# Patient Record
Sex: Female | Born: 1995 | Race: White | Hispanic: No | Marital: Single | Smoking: Never smoker
Health system: Southern US, Community
[De-identification: ages and names within clinical notes are randomized; demographics above are authoritative.]

## PROBLEM LIST (undated history)

## (undated) ENCOUNTER — Ambulatory Visit (HOSPITAL_COMMUNITY): Admission: EM | Payer: BC Managed Care – PPO | Source: Home / Self Care

## (undated) DIAGNOSIS — D689 Coagulation defect, unspecified: Secondary | ICD-10-CM

## (undated) DIAGNOSIS — F32A Depression, unspecified: Secondary | ICD-10-CM

## (undated) DIAGNOSIS — E559 Vitamin D deficiency, unspecified: Secondary | ICD-10-CM

## (undated) DIAGNOSIS — R000 Tachycardia, unspecified: Secondary | ICD-10-CM

## (undated) DIAGNOSIS — Z862 Personal history of diseases of the blood and blood-forming organs and certain disorders involving the immune mechanism: Secondary | ICD-10-CM

## (undated) DIAGNOSIS — F909 Attention-deficit hyperactivity disorder, unspecified type: Secondary | ICD-10-CM

## (undated) DIAGNOSIS — F419 Anxiety disorder, unspecified: Secondary | ICD-10-CM

## (undated) HISTORY — DX: Tachycardia, unspecified: R00.0

## (undated) HISTORY — DX: Depression, unspecified: F32.A

## (undated) HISTORY — DX: Attention-deficit hyperactivity disorder, unspecified type: F90.9

## (undated) HISTORY — PX: NO PAST SURGERIES: SHX2092

## (undated) HISTORY — DX: Coagulation defect, unspecified: D68.9

---
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
## 2014-07-23 ENCOUNTER — Other Ambulatory Visit: Payer: Self-pay | Admitting: Family Medicine

## 2014-07-23 DIAGNOSIS — E010 Iodine-deficiency related diffuse (endemic) goiter: Secondary | ICD-10-CM

## 2014-07-26 ENCOUNTER — Ambulatory Visit
Admission: RE | Admit: 2014-07-26 | Discharge: 2014-07-26 | Disposition: A | Discharge status: Home / Self Care | Payer: BLUE CROSS/BLUE SHIELD | Source: Ambulatory Visit | Attending: Family Medicine | Admitting: Family Medicine

## 2014-07-26 DIAGNOSIS — E010 Iodine-deficiency related diffuse (endemic) goiter: Secondary | ICD-10-CM

## 2015-07-19 ENCOUNTER — Other Ambulatory Visit: Payer: Self-pay | Admitting: Family Medicine

## 2015-07-19 DIAGNOSIS — E010 Iodine-deficiency related diffuse (endemic) goiter: Secondary | ICD-10-CM

## 2015-07-19 DIAGNOSIS — E040 Nontoxic diffuse goiter: Secondary | ICD-10-CM

## 2015-08-02 ENCOUNTER — Ambulatory Visit
Admission: RE | Admit: 2015-08-02 | Discharge: 2015-08-02 | Disposition: A | Discharge status: Home / Self Care | Payer: BLUE CROSS/BLUE SHIELD | Source: Ambulatory Visit | Attending: Family Medicine | Admitting: Family Medicine

## 2015-08-02 DIAGNOSIS — E040 Nontoxic diffuse goiter: Secondary | ICD-10-CM

## 2015-08-02 DIAGNOSIS — E010 Iodine-deficiency related diffuse (endemic) goiter: Secondary | ICD-10-CM

## 2016-07-02 DIAGNOSIS — J069 Acute upper respiratory infection, unspecified: Secondary | ICD-10-CM | POA: Diagnosis not present

## 2016-08-10 DIAGNOSIS — E040 Nontoxic diffuse goiter: Secondary | ICD-10-CM | POA: Diagnosis not present

## 2016-08-10 DIAGNOSIS — F909 Attention-deficit hyperactivity disorder, unspecified type: Secondary | ICD-10-CM | POA: Diagnosis not present

## 2016-09-11 ENCOUNTER — Other Ambulatory Visit: Payer: Self-pay | Admitting: Obstetrics and Gynecology

## 2016-09-11 ENCOUNTER — Other Ambulatory Visit (HOSPITAL_COMMUNITY)
Admission: RE | Admit: 2016-09-11 | Discharge: 2016-09-11 | Disposition: A | Discharge status: Home / Self Care | Payer: BLUE CROSS/BLUE SHIELD | Source: Ambulatory Visit | Attending: Obstetrics and Gynecology | Admitting: Obstetrics and Gynecology

## 2016-09-11 DIAGNOSIS — N920 Excessive and frequent menstruation with regular cycle: Secondary | ICD-10-CM | POA: Diagnosis not present

## 2016-09-11 DIAGNOSIS — Z01419 Encounter for gynecological examination (general) (routine) without abnormal findings: Secondary | ICD-10-CM | POA: Diagnosis not present

## 2016-09-11 DIAGNOSIS — Z3009 Encounter for other general counseling and advice on contraception: Secondary | ICD-10-CM | POA: Diagnosis not present

## 2016-09-11 DIAGNOSIS — Z113 Encounter for screening for infections with a predominantly sexual mode of transmission: Secondary | ICD-10-CM | POA: Diagnosis not present

## 2016-09-11 DIAGNOSIS — F3281 Premenstrual dysphoric disorder: Secondary | ICD-10-CM | POA: Diagnosis not present

## 2016-09-11 DIAGNOSIS — Z01411 Encounter for gynecological examination (general) (routine) with abnormal findings: Secondary | ICD-10-CM | POA: Diagnosis not present

## 2016-09-12 LAB — CYTOLOGY - PAP: DIAGNOSIS: NEGATIVE

## 2016-09-13 DIAGNOSIS — D696 Thrombocytopenia, unspecified: Secondary | ICD-10-CM | POA: Diagnosis not present

## 2016-10-04 ENCOUNTER — Encounter: Payer: Self-pay | Admitting: Hematology

## 2016-10-04 ENCOUNTER — Telehealth: Payer: Self-pay | Admitting: Hematology

## 2016-10-04 NOTE — Telephone Encounter (Signed)
Cld the pt to schedule an appt to see Dr. Candise CheKale on 4/30 at 1pm. Pt aware to arrive 30 minutes prior. Demographics verified. Letter mailed to the pt and faxed to the referring office

## 2016-10-23 DIAGNOSIS — N920 Excessive and frequent menstruation with regular cycle: Secondary | ICD-10-CM | POA: Diagnosis not present

## 2016-10-23 DIAGNOSIS — D696 Thrombocytopenia, unspecified: Secondary | ICD-10-CM | POA: Diagnosis not present

## 2016-11-05 ENCOUNTER — Ambulatory Visit: Payer: BLUE CROSS/BLUE SHIELD

## 2016-11-05 ENCOUNTER — Other Ambulatory Visit (HOSPITAL_BASED_OUTPATIENT_CLINIC_OR_DEPARTMENT_OTHER): Payer: BLUE CROSS/BLUE SHIELD

## 2016-11-05 ENCOUNTER — Ambulatory Visit (HOSPITAL_BASED_OUTPATIENT_CLINIC_OR_DEPARTMENT_OTHER): Payer: BLUE CROSS/BLUE SHIELD | Admitting: Hematology & Oncology

## 2016-11-05 VITALS — BP 108/71 | HR 101 | Temp 98.1°F | Resp 16 | Wt 114.8 lb

## 2016-11-05 DIAGNOSIS — D696 Thrombocytopenia, unspecified: Secondary | ICD-10-CM

## 2016-11-05 LAB — CBC WITH DIFFERENTIAL (CANCER CENTER ONLY)
BASO#: 0 10*3/uL (ref 0.0–0.2)
BASO%: 0.2 % (ref 0.0–2.0)
EOS ABS: 0.1 10*3/uL (ref 0.0–0.5)
EOS%: 1.3 % (ref 0.0–7.0)
HEMATOCRIT: 40.5 % (ref 34.8–46.6)
HEMOGLOBIN: 13.5 g/dL (ref 11.6–15.9)
LYMPH#: 1.2 10*3/uL (ref 0.9–3.3)
LYMPH%: 24.8 % (ref 14.0–48.0)
MCH: 30.8 pg (ref 26.0–34.0)
MCHC: 33.3 g/dL (ref 32.0–36.0)
MCV: 93 fL (ref 81–101)
MONO#: 0.5 10*3/uL (ref 0.1–0.9)
MONO%: 10.8 % (ref 0.0–13.0)
NEUT%: 62.9 % (ref 39.6–80.0)
NEUTROS ABS: 3 10*3/uL (ref 1.5–6.5)
Platelets: 137 10*3/uL — ABNORMAL LOW (ref 145–400)
RBC: 4.38 10*6/uL (ref 3.70–5.32)
RDW: 12.7 % (ref 11.1–15.7)
WBC: 4.7 10*3/uL (ref 3.9–10.0)

## 2016-11-05 LAB — CHCC SATELLITE - SMEAR

## 2016-11-05 NOTE — Progress Notes (Signed)
Referral MD  Reason for Referral: Mild thrombocytopenia and transient leukopenia   Chief Complaint  Patient presents with  . New Patient (Initial Visit)  : My platelet count is low.  HPI: Nancy Wallace is a very charming 21 year old white female. She comes in with her mother. She is adopted. Would do not know much about her family's past history.  She is currently working at a [employer].  She was seen by Dr.Varnado and has some routine lab work done. This was done I think because she was tired. She was found to have a very, very low vitamin D level. The vitamin D level was 5.6. However, a CBC was done. This showed a white cell count of 3.8. Hemoglobin 13.3. MCV 91. Platelet count was 114,000. She was tested for HIV and hepatitis C. Both were normal.  I think she was started on some agent to help with her menstrual cycles.  Because of the thrombocytopenia, it was felt that she needed re-seen by hematology. She was kindly referred to the [facility].  She does not smoke. She does not drink. She did go to college for one year and then came back and is now working.  She has had no weight loss or weight gain. She is not a 21.  She's had no change in bowel or bladder habits. She's had no cough. This been no rashes.  She has had some skin lesions removed which are benign.  There is been no hair loss. She's had no swallowing difficulties. She's had no visual problems.  Overall, her performance status is ECOG 0.   No past medical history on file.:  No past surgical history on file.:   Current Outpatient Prescriptions:  .  ADDERALL XR 15 MG 24 hr capsule, , Disp: , Rfl: 0 .  amphetamine-dextroamphetamine (ADDERALL) 5 MG tablet, , Disp: , Rfl: 0 .  FLUoxetine (PROZAC) 10 MG capsule, , Disp: , Rfl: 4 .  tranexamic acid (LYSTEDA) 650 MG TABS tablet, , Disp: , Rfl: 4 .  Vitamin D, Ergocalciferol, (DRISDOL) 50000 units CAPS capsule, , Disp: , Rfl: 0:  :  No  Known Allergies:  No family history on file.:  Social History   Social History  . Marital status: Single    Spouse name: N/A  . Number of children: N/A  . Years of education: N/A   Occupational History  . Not on file.   Social History Main Topics  . Smoking status: Not on file  . Smokeless tobacco: Not on file  . Alcohol use Not on file  . Drug use: Unknown  . Sexual activity: Not on file   Other Topics Concern  . Not on file   Social History Narrative  . No narrative on file  :  Pertinent items are noted in HPI.  Exam:Well-developed and well-nourished white female in no obvious distress. Vital signs show a temperature of 98.1. Pulse 101. Blood pressure 108/71. Weight is 114 pounds. Head and neck exam shows no ocular or oral lesions. There are no palpable cervical or supraclavicular lymph nodes. Lungs are clear bilaterally. Cardiac exam regular rate and rhythm with no murmurs, rubs or bruits. Axillary exam shows no bilateral axillary adenopathy. Abdomen is soft. She has good bowel sounds. There is no fluid wave. There is no palpable liver or spleen tip. Back exam shows no tenderness over the spine, ribs or hips. Extremities shows no clubbing, cyanosis or edema. Shows good range motion of her joints. There  is no joint swelling, warmth or inflammation. Skin exam does show a couple hyperpigmented lesions on the upper part of her chest. There are slightly dark and irregular in shape. Neurological exam shows no focal neurological deficits.    Recent Labs  11/05/16 1511  WBC 4.7  HGB 13.5  HCT 40.5  PLT 137 Platelet count confirmed by slide estimate*   No results for input(s): NA, K, CL, CO2, GLUCOSE, BUN, CREATININE, CALCIUM in the last 72 hours.  Blood smear review: Normochromic and normocytic population of red blood cells. She has no nucleated red blood cells. There are no teardrop cells. She has no rouleau formation. I see no target cells. She has no inclusion bodies. White  blood cells. Normal in morphology maturation. She has no immature myeloid or lymphoid forms. She has no hyper segmented polys. Platelets are minimally decreased in number. Platelets are well granulated. Platelets are relatively uniform in size.  Pathology: None     Assessment and Plan:  Nancy Wallace is a very charming 21 year old white female. Again, she is adopted.  I just don't see anything that looks suspicious on her blood smear or on her physical exam. 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 I'll see anything that would suggest a hematologic malignancy. She clearly does not have any leukemia or other bone marrow disorder.  I spent about 45 minutes with she and her mom. I went over the lab work with them.  With her medications, I suppose that the medications may cause some issues. She is on Adderall. She is on Prozac. These can certainly affect her blood counts but I would not take her off these.  I think we should get her back in 4 months. I think everything still looks good in 4 months, then we can let her go from the clinic.  It was a lot of fun talking with she and her mom. 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 This actually is only 5 minutes from where I live. Thankfully, the 20th of that hit last Sunday did not affect their home.

## 2016-11-12 ENCOUNTER — Encounter: Payer: BLUE CROSS/BLUE SHIELD | Admitting: Hematology

## 2016-12-18 DIAGNOSIS — E559 Vitamin D deficiency, unspecified: Secondary | ICD-10-CM | POA: Diagnosis not present

## 2016-12-28 DIAGNOSIS — J029 Acute pharyngitis, unspecified: Secondary | ICD-10-CM | POA: Diagnosis not present

## 2017-02-08 DIAGNOSIS — F909 Attention-deficit hyperactivity disorder, unspecified type: Secondary | ICD-10-CM | POA: Diagnosis not present

## 2017-02-18 ENCOUNTER — Ambulatory Visit (HOSPITAL_BASED_OUTPATIENT_CLINIC_OR_DEPARTMENT_OTHER): Payer: BLUE CROSS/BLUE SHIELD | Admitting: Hematology & Oncology

## 2017-02-18 ENCOUNTER — Other Ambulatory Visit (HOSPITAL_BASED_OUTPATIENT_CLINIC_OR_DEPARTMENT_OTHER): Payer: BLUE CROSS/BLUE SHIELD

## 2017-02-18 VITALS — BP 123/72 | HR 72 | Temp 98.4°F | Resp 16 | Wt 112.0 lb

## 2017-02-18 DIAGNOSIS — D696 Thrombocytopenia, unspecified: Secondary | ICD-10-CM

## 2017-02-18 DIAGNOSIS — D693 Immune thrombocytopenic purpura: Secondary | ICD-10-CM

## 2017-02-18 LAB — CHCC SATELLITE - SMEAR

## 2017-02-18 LAB — CBC WITH DIFFERENTIAL (CANCER CENTER ONLY)
BASO#: 0 10*3/uL (ref 0.0–0.2)
BASO%: 0.2 % (ref 0.0–2.0)
EOS%: 0.4 % (ref 0.0–7.0)
Eosinophils Absolute: 0 10*3/uL (ref 0.0–0.5)
HEMATOCRIT: 41.2 % (ref 34.8–46.6)
HEMOGLOBIN: 13.8 g/dL (ref 11.6–15.9)
LYMPH#: 1 10*3/uL (ref 0.9–3.3)
LYMPH%: 16.8 % (ref 14.0–48.0)
MCH: 31.2 pg (ref 26.0–34.0)
MCHC: 33.5 g/dL (ref 32.0–36.0)
MCV: 93 fL (ref 81–101)
MONO#: 0.4 10*3/uL (ref 0.1–0.9)
MONO%: 7.1 % (ref 0.0–13.0)
NEUT%: 75.5 % (ref 39.6–80.0)
NEUTROS ABS: 4.3 10*3/uL (ref 1.5–6.5)
Platelets: 117 10*3/uL — ABNORMAL LOW (ref 145–400)
RBC: 4.42 10*6/uL (ref 3.70–5.32)
RDW: 12.5 % (ref 11.1–15.7)
WBC: 5.7 10*3/uL (ref 3.9–10.0)

## 2017-02-18 NOTE — Progress Notes (Signed)
Hematology and Oncology Follow Up Visit  Nancy Wallace [id number] 08-08-1995 20 y.o. 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 We first saw her back in April. At that time, her platelet count was 137,000. By her blood smear, I thought that she probably had an element of mild immune thrombocytopenia.  She has had a busy summer. She just got back from the beach. Her family was down there. She had a great time. She is very liberal with sunscreen.  She's had no bleeding. She's had no bruising. She's had no change in bowel or bladder habits. She's had no issues with her monthly cycle.  She is working full-time. She will start school later on this month.  She's had no fever. She's had no headache. His been no cough.  The really has not been any change in her medications.  Her appetite is good. She is not a vegetarian.  Overall, her performance status is ECOG 0.  Medications:  Current Outpatient Prescriptions:  .  ADDERALL XR 15 MG 24 hr capsule, , Disp: , Rfl: 0 .  amphetamine-dextroamphetamine (ADDERALL) 5 MG tablet, 10 mg. , Disp: , Rfl: 0 .  FLUoxetine (PROZAC) 10 MG capsule, , Disp: , Rfl: 4 .  tranexamic acid (LYSTEDA) 650 MG TABS tablet, , Disp: , Rfl: 4 .  Vitamin D, Ergocalciferol, (DRISDOL) 50000 units CAPS capsule, , Disp: , Rfl: 0  Allergies: No Known Allergies  Past Medical History, Surgical history, Social history, and Family History were reviewed and updated.  Review of Systems:  As above  Physical Exam:  weight is 112 lb (50.8 kg). Her oral temperature is 98.4 F (36.9 C). Her blood pressure is 123/72 and her pulse is 72. Her respiration is 16 and oxygen saturation is 94%.   Wt Readings from Last 3 Encounters:  02/18/17 112 lb (50.8 kg)  11/05/16 114 lb 12.8 oz (52.1 kg)     Head and neck exam shows  no ocular or oral lesions. There are no palpable cervical or supraclavicular lymph nodes. Lungs are clear bilaterally. Cardiac exam regular rate and rhythm with no murmurs, rubs or bruits. Axillary exam shows no bilateral axillary adenopathy. Abdomen is soft. She has good bowel sounds. There is no fluid wave. There is no palpable liver or spleen tip. Back exam shows no tenderness over the spine, ribs or hips. Extremities shows no clubbing, cyanosis or edema. Shows good range motion of her joints. There is no joint swelling, warmth or inflammation. Skin exam does show a couple hyperpigmented lesions on the upper part of her chest. There are slightly dark and irregular in shape. Neurological exam shows no focal neurological deficits.  Lab Results  Component Value Date   WBC 5.7 02/18/2017   HGB 13.8 02/18/2017   HCT 41.2 02/18/2017   MCV 93 02/18/2017   PLT 117 Platelet count consistent in citrate (L) 02/18/2017     Chemistry   No results found for: NA, K, CL, CO2, BUN, CREATININE, GLU No results found for: CALCIUM, ALKPHOS, AST, ALT, BILITOT       Impression and Plan: Nancy Wallace is a24 year old white female. She has mild thrombocytopenia. Again I have to believe that this is immune-based.  I cannot find anything on her exam that was suggestive and hematologic malignancy. I don't think we had to put her through a workup for  collagen vascular disease.  She definitely does not need a bone marrow test.  I think we can get her back in 4 months. I think this would be very reasonable.  She was noted that she come back sooner for lab work if she starts having any kind of symptoms.   Josph MachoENNEVER,PETER R, MD 8/6/20184:54 PM

## 2017-07-01 ENCOUNTER — Other Ambulatory Visit: Payer: Self-pay

## 2017-07-01 ENCOUNTER — Other Ambulatory Visit (HOSPITAL_BASED_OUTPATIENT_CLINIC_OR_DEPARTMENT_OTHER): Payer: BLUE CROSS/BLUE SHIELD

## 2017-07-01 ENCOUNTER — Ambulatory Visit (HOSPITAL_BASED_OUTPATIENT_CLINIC_OR_DEPARTMENT_OTHER): Payer: BLUE CROSS/BLUE SHIELD | Admitting: Hematology & Oncology

## 2017-07-01 ENCOUNTER — Encounter: Payer: Self-pay | Admitting: Hematology & Oncology

## 2017-07-01 VITALS — BP 130/80 | HR 113 | Temp 98.3°F | Resp 16 | Wt 112.0 lb

## 2017-07-01 DIAGNOSIS — D693 Immune thrombocytopenic purpura: Secondary | ICD-10-CM | POA: Diagnosis not present

## 2017-07-01 LAB — CBC WITH DIFFERENTIAL (CANCER CENTER ONLY)
BASO#: 0 10*3/uL (ref 0.0–0.2)
BASO%: 0.2 % (ref 0.0–2.0)
EOS ABS: 0 10*3/uL (ref 0.0–0.5)
EOS%: 0.4 % (ref 0.0–7.0)
HEMATOCRIT: 42.2 % (ref 34.8–46.6)
HGB: 14 g/dL (ref 11.6–15.9)
LYMPH#: 1 10*3/uL (ref 0.9–3.3)
LYMPH%: 22.4 % (ref 14.0–48.0)
MCH: 31 pg (ref 26.0–34.0)
MCHC: 33.2 g/dL (ref 32.0–36.0)
MCV: 93 fL (ref 81–101)
MONO#: 0.3 10*3/uL (ref 0.1–0.9)
MONO%: 7.5 % (ref 0.0–13.0)
NEUT#: 3.2 10*3/uL (ref 1.5–6.5)
NEUT%: 69.5 % (ref 39.6–80.0)
Platelets: 146 10*3/uL (ref 145–400)
RBC: 4.52 10*6/uL (ref 3.70–5.32)
RDW: 12.4 % (ref 11.1–15.7)
WBC: 4.6 10*3/uL (ref 3.9–10.0)

## 2017-07-01 LAB — CHCC SATELLITE - SMEAR

## 2017-07-01 NOTE — Progress Notes (Signed)
Hematology and Oncology Follow Up Visit  Nancy HagemanGrace Wallace [id number] [birth year] 21 y.o. 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  She is doing well.  She is no longer working for a [employer].  This makes me a little bit sad.  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  She is going back to college.  This is very good for her.  She has had no problems with bleeding or bruising.  Her monthly cycles are not that heavy.  She had no fever.  She has had no rashes.  She had no cough or shortness of breath.  There is been no nausea or vomiting.  She has had no change in bowel or bladder habits.  Overall, her performance status is ECOG 0.  Medications:  Current Outpatient Medications:  .  norgestimate-ethinyl estradiol (ORTHO-CYCLEN,SPRINTEC,PREVIFEM) 0.25-35 MG-MCG tablet, Take 1 tablet by mouth daily., Disp: , Rfl:  .  ADDERALL XR 15 MG 24 hr capsule, , Disp: , Rfl: 0 .  amphetamine-dextroamphetamine (ADDERALL) 10 MG tablet, Take 10 mg by mouth., Disp: , Rfl: 0 .  FLUoxetine (PROZAC) 10 MG capsule, , Disp: , Rfl: 4 .  tranexamic acid (LYSTEDA) 650 MG TABS tablet, , Disp: , Rfl: 4 .  Vitamin D, Ergocalciferol, (DRISDOL) 50000 units CAPS capsule, , Disp: , Rfl: 0  Allergies: No Known Allergies  Past Medical History, Surgical history, Social history, and Family History were reviewed and updated.  Review of Systems: As stated in the interim history  Physical Exam:  weight is 112 lb (50.8 kg). Her oral temperature is 98.3 F (36.8 C). Her blood pressure is 130/80 and her pulse is 113 (abnormal). Her respiration is 16 and oxygen saturation is 99%.   Wt Readings from Last 3 Encounters:  07/01/17 112 lb (50.8 kg)  02/18/17 112 lb (50.8 kg)  11/05/16 114 lb 12.8 oz (52.1 kg)     Physical Exam  Constitutional: She is oriented to  person, place, and time.  HENT:  Head: Normocephalic and atraumatic.  Mouth/Throat: Oropharynx is clear and moist.  Eyes: EOM are normal. Pupils are equal, round, and reactive to light.  Neck: Normal range of motion.  Cardiovascular: Normal rate, regular rhythm and normal heart sounds.  Pulmonary/Chest: Effort normal and breath sounds normal.  Abdominal: Soft. Bowel sounds are normal.  Musculoskeletal: Normal range of motion. She exhibits no edema, tenderness or deformity.  Lymphadenopathy:    She has no cervical adenopathy.  Neurological: She is alert and oriented to person, place, and time.  Skin: Skin is warm and dry. No rash noted. No erythema.  Psychiatric: She has a normal mood and affect. Her behavior is normal. Judgment and thought content normal.  Vitals reviewed.    Lab Results  Component Value Date   WBC 4.6 07/01/2017   HGB 14.0 07/01/2017   HCT 42.2 07/01/2017   MCV 93 07/01/2017   PLT 146 Platelet count consistent in citrate 07/01/2017     Chemistry   No results found for: NA, K, CL, CO2, BUN, CREATININE, GLU No results found for: CALCIUM, ALKPHOS, AST, ALT, BILITOT       Impression and Plan: Nancy Wallace is a 21 year old white female. She has mild thrombocytopenia. Again I have to believe that this is immune-based.  Her platelet count is normal today.  I looked at her blood under  the microscope.  Everything looked okay.  We will plan to get her back in 6 months.  I think this would be reasonable.  I told her that she can always come back sooner if she has any problems with bruising or bleeding.  I am glad that she is going to get back into college.  She sounds very excited to get started in January.    Josph MachoPeter R Loyde Orth, MD 12/17/20183:58 PM

## 2017-07-02 LAB — LACTATE DEHYDROGENASE: LDH: 133 U/L (ref 125–245)

## 2017-09-12 DIAGNOSIS — Z01419 Encounter for gynecological examination (general) (routine) without abnormal findings: Secondary | ICD-10-CM | POA: Diagnosis not present

## 2017-09-12 DIAGNOSIS — Z113 Encounter for screening for infections with a predominantly sexual mode of transmission: Secondary | ICD-10-CM | POA: Diagnosis not present

## 2017-11-11 DIAGNOSIS — H04123 Dry eye syndrome of bilateral lacrimal glands: Secondary | ICD-10-CM | POA: Diagnosis not present

## 2017-11-11 DIAGNOSIS — H5213 Myopia, bilateral: Secondary | ICD-10-CM | POA: Diagnosis not present

## 2017-12-23 ENCOUNTER — Inpatient Hospital Stay: Payer: BLUE CROSS/BLUE SHIELD | Attending: Hematology & Oncology | Admitting: Hematology & Oncology

## 2017-12-23 ENCOUNTER — Inpatient Hospital Stay: Payer: BLUE CROSS/BLUE SHIELD

## 2017-12-23 ENCOUNTER — Other Ambulatory Visit: Payer: Self-pay

## 2017-12-23 VITALS — BP 127/84 | HR 93 | Temp 98.9°F | Resp 16 | Wt 113.0 lb

## 2017-12-23 DIAGNOSIS — D696 Thrombocytopenia, unspecified: Secondary | ICD-10-CM | POA: Insufficient documentation

## 2017-12-23 DIAGNOSIS — D693 Immune thrombocytopenic purpura: Secondary | ICD-10-CM

## 2017-12-23 LAB — CBC WITH DIFFERENTIAL (CANCER CENTER ONLY)
BASOS ABS: 0 10*3/uL (ref 0.0–0.1)
BASOS PCT: 0 %
EOS ABS: 0.1 10*3/uL (ref 0.0–0.5)
EOS PCT: 2 %
HCT: 43.9 % (ref 34.8–46.6)
Hemoglobin: 14.8 g/dL (ref 11.6–15.9)
Lymphocytes Relative: 32 %
Lymphs Abs: 1.3 10*3/uL (ref 0.9–3.3)
MCH: 31 pg (ref 25.1–34.0)
MCHC: 33.7 g/dL (ref 31.5–36.0)
MCV: 92 fL (ref 79.5–101.0)
MONO ABS: 0.3 10*3/uL (ref 0.1–0.9)
MONOS PCT: 8 %
NEUTROS ABS: 2.3 10*3/uL (ref 1.5–6.5)
Neutrophils Relative %: 58 %
PLATELETS: 116 10*3/uL — AB (ref 145–400)
RBC: 4.77 MIL/uL (ref 3.70–5.45)
RDW: 12.7 % (ref 11.2–14.5)
WBC Count: 4 10*3/uL (ref 3.9–10.3)

## 2017-12-23 LAB — PLATELET BY CITRATE

## 2017-12-23 NOTE — Progress Notes (Signed)
Hematology and Oncology Follow Up Visit  Nancy Wallace [id number] [birth year] 21 y.o. 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  She really is doing well.  She is taking classes at college for the summer.  She really wants to try to finish up with college.  She was working at a [employer].  She just does not have time to do this any longer.  She does state that every now than when she gets up in the morning she has some nausea.  Rarely will she have vomiting.  There is no associated tinnitus.  There is no vertigo.  She has no fever.  She has no sweats.  She has no obvious change in bowel or bladder habits.  She has had no bleeding.  Her monthly cycles have been doing fairly well.  She has had no leg swelling.  She has had no rashes.  Overall, her performance status is ECOG 0.  Medications:  Current Outpatient Medications:  .  Levonorgestrel-Ethinyl Estradiol (AMETHIA) 0.15-0.03 &0.01 MG tablet, Take 1 tablet by mouth daily., Disp: , Rfl:  .  ADDERALL XR 15 MG 24 hr capsule, , Disp: , Rfl: 0 .  amphetamine-dextroamphetamine (ADDERALL) 10 MG tablet, Take 10 mg by mouth., Disp: , Rfl: 0 .  FLUoxetine (PROZAC) 10 MG capsule, , Disp: , Rfl: 4 .  tranexamic acid (LYSTEDA) 650 MG TABS tablet, , Disp: , Rfl: 4 .  Vitamin D, Ergocalciferol, (DRISDOL) 50000 units CAPS capsule, , Disp: , Rfl: 0  Allergies: No Known Allergies  Past Medical History, Surgical history, Social history, and Family History were reviewed and updated.  Review of Systems: Review of Systems  Constitutional: Negative.   HENT: Negative.   Eyes: Negative.   Respiratory: Negative.   Cardiovascular: Negative.   Gastrointestinal: Positive for nausea.  Genitourinary: Negative.   Musculoskeletal: Negative.   Skin: Negative.   Neurological: Negative.     Endo/Heme/Allergies: Negative.   Psychiatric/Behavioral: Negative.      Physical Exam:  weight is 113 lb (51.3 kg). Her oral temperature is 98.9 F (37.2 C). Her blood pressure is 127/84 and her pulse is 93. Her respiration is 16 and oxygen saturation is 100%.   Wt Readings from Last 3 Encounters:  12/23/17 113 lb (51.3 kg)  07/01/17 112 lb (50.8 kg)  02/18/17 112 lb (50.8 kg)     Physical Exam  Constitutional: She is oriented to person, place, and time.  HENT:  Head: Normocephalic and atraumatic.  Mouth/Throat: Oropharynx is clear and moist.  Eyes: Pupils are equal, round, and reactive to light. EOM are normal.  Neck: Normal range of motion.  Cardiovascular: Normal rate, regular rhythm and normal heart sounds.  Pulmonary/Chest: Effort normal and breath sounds normal.  Abdominal: Soft. Bowel sounds are normal.  Musculoskeletal: Normal range of motion. She exhibits no edema, tenderness or deformity.  Lymphadenopathy:    She has no cervical adenopathy.  Neurological: She is alert and oriented to person, place, and time.  Skin: Skin is warm and dry. No rash noted. No erythema.  Psychiatric: She has a normal mood and affect. Her behavior is normal. Judgment and thought content normal.  Vitals reviewed.    Lab Results  Component Value Date   WBC 4.0 12/23/2017   HGB 14.8 12/23/2017   HCT 43.9 12/23/2017   MCV 92.0 12/23/2017   PLT PENDING  12/23/2017     Chemistry   No results found for: NA, K, CL, CO2, BUN, CREATININE, GLU No results found for: CALCIUM, ALKPHOS, AST, ALT, BILITOT       Impression and Plan: Nancy Wallace is a [age] year old white female. She has mild thrombocytopenia. Again I have to believe that this is immune-based.  Her platelet count is normal today.  I looked at her blood under the microscope.  Everything looked okay.  We will plan to get her back in 6 months.  I think this would be reasonable.  I told her that she can always come back sooner if  she has any problems with bruising or bleeding.   Josph MachoPeter R Ennever, MD 6/10/20194:40 PM

## 2017-12-24 ENCOUNTER — Telehealth: Payer: Self-pay | Admitting: *Deleted

## 2017-12-24 NOTE — Telephone Encounter (Addendum)
Patient is aware of results  ----- Message from Josph MachoPeter R Ennever, MD sent at 12/24/2017  6:37 AM EDT ----- Call - platelet count is 116K. This is still ok!! pete

## 2018-06-01 ENCOUNTER — Ambulatory Visit: Payer: Self-pay | Admitting: Family Medicine

## 2018-06-01 ENCOUNTER — Encounter: Payer: Self-pay | Admitting: Family Medicine

## 2018-06-01 VITALS — BP 112/78 | HR 100 | Temp 98.5°F | Wt 112.4 lb

## 2018-06-01 DIAGNOSIS — H109 Unspecified conjunctivitis: Secondary | ICD-10-CM

## 2018-06-01 MED ORDER — CIPROFLOXACIN HCL 0.3 % OP SOLN: 1.0000 [drp] | OPHTHALMIC | 0 refills | Status: AC

## 2018-06-01 NOTE — Progress Notes (Signed)
  Nancy HagemanGrace Benak is a 22 y.o. female who presents today with concerns of eye pain. This is her third exacerbation in the last 90 days. She reports that she did not discard all of her eye makeup after the last exacerbation and recently used heavy eye make up. She is a long term contact lens wearer and uses monthly contacts that she removes nightly. She reports 4 days of eye redness, pain, blurred vision, and complains during provider interview of a shading or darkening of vision in the evenings. 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   Review of Systems  Constitutional: Negative for chills, fever and malaise/fatigue.  HENT: Negative for congestion, ear discharge, ear pain, sinus pain and sore throat.   Eyes: Positive for blurred vision, photophobia, pain and redness. Negative for double vision and discharge.  Respiratory: Negative for cough, sputum production and shortness of breath.   Cardiovascular: Negative.  Negative for chest pain.  Gastrointestinal: Negative for abdominal pain, diarrhea, nausea and vomiting.  Genitourinary: Negative for dysuria, frequency, hematuria and urgency.  Musculoskeletal: Negative for myalgias.  Skin: Negative.   Neurological: Negative for headaches.  Endo/Heme/Allergies: Negative.   Psychiatric/Behavioral: Negative.     O: Vitals:   06/01/18 1106  BP: 112/78  Pulse: 100  Temp: 98.5 F (36.9 C)  SpO2: 97%     Physical Exam  Constitutional: She is oriented to person, place, and time. Vital signs are normal. She appears well-developed and well-nourished. She is active.  Non-toxic appearance. She does not have a sickly appearance.  HENT:  Head: Normocephalic.  Right Ear: Hearing, tympanic membrane, external ear and ear canal normal.  Left Ear: Hearing, tympanic membrane, external ear and ear canal normal.  Nose: Nose normal.    Mouth/Throat: Uvula is midline and oropharynx is clear and moist.  Eyes: Pupils are equal, round, and reactive to light. EOM and lids are normal. Right conjunctiva is injected. Right conjunctiva has a hemorrhage. Left conjunctiva is injected. Left conjunctiva has a hemorrhage.    Neck: Normal range of motion. Neck supple.  Cardiovascular: Normal rate, regular rhythm, normal heart sounds and normal pulses.  Pulmonary/Chest: Effort normal and breath sounds normal.  Abdominal: Soft. Bowel sounds are normal.  Musculoskeletal: Normal range of motion.  Lymphadenopathy:       Head (right side): No submental and no submandibular adenopathy present.       Head (left side): No submental and no submandibular adenopathy present.    She has no cervical adenopathy.  Neurological: She is alert and oriented to person, place, and time.  Psychiatric: She has a normal mood and affect.  Vitals reviewed.    A: 1. Conjunctivitis of left eye, unspecified conjunctivitis type      P: Discussed exam findings, diagnosis etiology and medication use and indications reviewed with patient. Follow- Up and discharge instructions provided. No emergent/urgent issues found on exam.  Patient verbalized understanding of information provided and agrees with plan of care (POC), all questions answered.  1. Conjunctivitis of left eye, unspecified conjunctivitis type - ciprofloxacin (CILOXAN) 0.3 % ophthalmic solution; Place 1 drop into both eyes every 4 (four) hours while awake for 5 days.  PLAN<  Follow up with eye care specialist in the next 24-72 hours Wear glasses instead of contacts Use eye drops provided as directed

## 2018-06-01 NOTE — Patient Instructions (Signed)
Bacterial Conjunctivitis  PLAN<  Follow up with eye care specialist in the next 24-72 hours Wear glasses instead of contacts Use eye drops provided as directed  Bacterial conjunctivitis is an infection of your conjunctiva. This is the clear membrane that covers the white part of your eye and the inner surface of your eyelid. This condition can make your eye:  Red or pink.  Itchy.  This condition is caused by bacteria. This condition spreads very easily from person to person (is contagious) and from one eye to the other eye. Follow these instructions at home: Medicines  Take or apply your antibiotic medicine as told by your doctor. Do not stop taking or applying the antibiotic even if you start to feel better.  Take or apply over-the-counter and prescription medicines only as told by your doctor.  Do not touch your eyelid with the eye drop bottle or the ointment tube. Managing discomfort  Wipe any fluid from your eye with a warm, wet washcloth or a cotton ball.  Place a cool, clean washcloth on your eye. Do this for 10-20 minutes, 3-4 times per day. General instructions  Do not wear contact lenses until the irritation is gone. Wear glasses until your doctor says it is okay to wear contacts.  Do not wear eye makeup until your symptoms are gone. Throw away any old makeup.  Change or wash your pillowcase every day.  Do not share towels or washcloths with anyone.  Wash your hands often with soap and water. Use paper towels to dry your hands.  Do not touch or rub your eyes.  Do not drive or use heavy machinery if your vision is blurry. Contact a doctor if:  You have a fever.  Your symptoms do not get better after 10 days. Get help right away if:  You have a fever and your symptoms suddenly get worse.  You have very bad pain when you move your eye.  Your face: ? Hurts. ? Is red. ? Is swollen.  You have sudden loss of vision. This information is not intended to  replace advice given to you by your health care provider. Make sure you discuss any questions you have with your health care provider. Document Released: 04/10/2008 Document Revised: 12/08/2015 Document Reviewed: 04/14/2015 Elsevier Interactive Patient Education  [employer]2018 [employer]

## 2018-06-02 DIAGNOSIS — H16012 Central corneal ulcer, left eye: Secondary | ICD-10-CM | POA: Diagnosis not present

## 2018-06-04 DIAGNOSIS — H16012 Central corneal ulcer, left eye: Secondary | ICD-10-CM | POA: Diagnosis not present

## 2018-06-06 DIAGNOSIS — H16012 Central corneal ulcer, left eye: Secondary | ICD-10-CM | POA: Diagnosis not present

## 2018-06-10 DIAGNOSIS — H16012 Central corneal ulcer, left eye: Secondary | ICD-10-CM | POA: Diagnosis not present

## 2018-06-23 ENCOUNTER — Other Ambulatory Visit: Payer: BLUE CROSS/BLUE SHIELD

## 2018-06-23 ENCOUNTER — Ambulatory Visit: Payer: BLUE CROSS/BLUE SHIELD | Admitting: Hematology & Oncology

## 2018-06-30 ENCOUNTER — Encounter: Payer: Self-pay | Admitting: Hematology & Oncology

## 2018-06-30 ENCOUNTER — Inpatient Hospital Stay: Payer: BLUE CROSS/BLUE SHIELD | Attending: Hematology & Oncology

## 2018-06-30 ENCOUNTER — Inpatient Hospital Stay (HOSPITAL_BASED_OUTPATIENT_CLINIC_OR_DEPARTMENT_OTHER): Payer: BLUE CROSS/BLUE SHIELD | Admitting: Hematology & Oncology

## 2018-06-30 ENCOUNTER — Other Ambulatory Visit: Payer: Self-pay

## 2018-06-30 VITALS — BP 124/84 | HR 97 | Temp 99.1°F | Resp 18 | Wt 111.2 lb

## 2018-06-30 DIAGNOSIS — D693 Immune thrombocytopenic purpura: Secondary | ICD-10-CM

## 2018-06-30 DIAGNOSIS — Z793 Long term (current) use of hormonal contraceptives: Secondary | ICD-10-CM

## 2018-06-30 DIAGNOSIS — Z79899 Other long term (current) drug therapy: Secondary | ICD-10-CM | POA: Diagnosis not present

## 2018-06-30 LAB — CBC WITH DIFFERENTIAL (CANCER CENTER ONLY)
Abs Immature Granulocytes: 0.01 10*3/uL (ref 0.00–0.07)
BASOS ABS: 0 10*3/uL (ref 0.0–0.1)
BASOS PCT: 1 %
EOS ABS: 0.1 10*3/uL (ref 0.0–0.5)
Eosinophils Relative: 2 %
HCT: 43.4 % (ref 36.0–46.0)
Hemoglobin: 13.9 g/dL (ref 12.0–15.0)
IMMATURE GRANULOCYTES: 0 %
Lymphocytes Relative: 35 %
Lymphs Abs: 1.4 10*3/uL (ref 0.7–4.0)
MCH: 30.8 pg (ref 26.0–34.0)
MCHC: 32 g/dL (ref 30.0–36.0)
MCV: 96 fL (ref 80.0–100.0)
MONOS PCT: 8 %
Monocytes Absolute: 0.3 10*3/uL (ref 0.1–1.0)
NEUTROS PCT: 54 %
NRBC: 0 % (ref 0.0–0.2)
Neutro Abs: 2.1 10*3/uL (ref 1.7–7.7)
Platelet Count: 123 10*3/uL — ABNORMAL LOW (ref 150–400)
RBC: 4.52 MIL/uL (ref 3.87–5.11)
RDW: 11.9 % (ref 11.5–15.5)
WBC Count: 3.9 10*3/uL — ABNORMAL LOW (ref 4.0–10.5)

## 2018-06-30 LAB — PLATELET BY CITRATE

## 2018-06-30 NOTE — Progress Notes (Signed)
Hematology and Oncology Follow Up Visit  Nancy Wallace [id number] 15-May-1996 22 y.o. 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  She is doing quite well.  She is in school full-time.  She is [facility] [employer].  She really enjoys it.  She just finished up her fall semester.  She has had no problems with rashes.  There is been no bleeding.  There is been no bruising.  She is now off a couple of her medications.  She has had no cough or shortness of breath.  There is been no change in bowel or bladder habits.  She has had no headache.  She has had no leg swelling.  Overall, her performance status is ECOG 0   Medications:  Current Outpatient Medications:  .  ADDERALL XR 15 MG 24 hr capsule, , Disp: , Rfl: 0 .  amphetamine-dextroamphetamine (ADDERALL) 10 MG tablet, Take 10 mg by mouth., Disp: , Rfl: 0 .  FLUoxetine (PROZAC) 10 MG capsule, , Disp: , Rfl: 4 .  Levonorgestrel-Ethinyl Estradiol (AMETHIA) 0.15-0.03 &0.01 MG tablet, Take 1 tablet by mouth daily., Disp: , Rfl:  .  tranexamic acid (LYSTEDA) 650 MG TABS tablet, , Disp: , Rfl: 4 .  Vitamin D, Ergocalciferol, (DRISDOL) 50000 units CAPS capsule, , Disp: , Rfl: 0  Allergies: No Known Allergies  Past Medical History, Surgical history, Social history, and Family History were reviewed and updated.  Review of Systems: Review of Systems  Constitutional: Negative.   HENT: Negative.   Eyes: Negative.   Respiratory: Negative.   Cardiovascular: Negative.   Gastrointestinal: Positive for nausea.  Genitourinary: Negative.   Musculoskeletal: Negative.   Skin: Negative.   Neurological: Negative.   Endo/Heme/Allergies: Negative.   Psychiatric/Behavioral: Negative.      Physical Exam:  weight is 111 lb 4 oz (50.5 kg). Her oral temperature is 99.1 F (37.3 C). Her blood pressure is 124/84 and  her pulse is 97. Her respiration is 18 and oxygen saturation is 100%.   Wt Readings from Last 3 Encounters:  06/30/18 111 lb 4 oz (50.5 kg)  06/01/18 112 lb 6.4 oz (51 kg)  12/23/17 113 lb (51.3 kg)     Physical Exam Vitals signs reviewed.  HENT:     Head: Normocephalic and atraumatic.  Eyes:     Pupils: Pupils are equal, round, and reactive to light.  Neck:     Musculoskeletal: Normal range of motion.  Cardiovascular:     Rate and Rhythm: Normal rate and regular rhythm.     Heart sounds: Normal heart sounds.  Pulmonary:     Effort: Pulmonary effort is normal.     Breath sounds: Normal breath sounds.  Abdominal:     General: Bowel sounds are normal.     Palpations: Abdomen is soft.  Musculoskeletal: Normal range of motion.        General: No tenderness or deformity.  Lymphadenopathy:     Cervical: No cervical adenopathy.  Skin:    General: Skin is warm and dry.     Findings: No erythema or rash.  Neurological:     Mental Status: She is alert and oriented to person, place, and time.  Psychiatric:        Behavior: Behavior normal.        Thought Content: Thought content normal.        Judgment: Judgment  normal.      Lab Results  Component Value Date   WBC 3.9 (L) 06/30/2018   HGB 13.9 06/30/2018   HCT 43.4 06/30/2018   MCV 96.0 06/30/2018   PLT 123 (L) 06/30/2018     Chemistry   No results found for: NA, K, CL, CO2, BUN, CREATININE, GLU No results found for: CALCIUM, ALKPHOS, AST, ALT, BILITOT       Impression and Plan: Nancy Wallace is a 22 year old white female. She has mild thrombocytopenia. Again I have to believe that this is immune-based.  I looked at her blood under the microscope.  I really was not too impressed.  Her platelets looked large.  Platelets were well granulated.  I still believe that we are looking at mild immune thrombocytopenia.  I think that we can probably get her back yearly now.  We really are not doing all that much for her.   She knows that she can always come back sooner if she has any issues.   Josph Macho, MD 12/16/201912:34 PM

## 2018-09-02 DIAGNOSIS — E559 Vitamin D deficiency, unspecified: Secondary | ICD-10-CM | POA: Diagnosis not present

## 2018-09-02 DIAGNOSIS — F909 Attention-deficit hyperactivity disorder, unspecified type: Secondary | ICD-10-CM | POA: Diagnosis not present

## 2018-09-02 DIAGNOSIS — F324 Major depressive disorder, single episode, in partial remission: Secondary | ICD-10-CM | POA: Diagnosis not present

## 2018-09-15 DIAGNOSIS — F418 Other specified anxiety disorders: Secondary | ICD-10-CM | POA: Diagnosis not present

## 2018-09-15 DIAGNOSIS — Z309 Encounter for contraceptive management, unspecified: Secondary | ICD-10-CM | POA: Diagnosis not present

## 2018-09-15 DIAGNOSIS — Z113 Encounter for screening for infections with a predominantly sexual mode of transmission: Secondary | ICD-10-CM | POA: Diagnosis not present

## 2018-09-15 DIAGNOSIS — K1370 Unspecified lesions of oral mucosa: Secondary | ICD-10-CM | POA: Diagnosis not present

## 2018-09-15 DIAGNOSIS — Z01411 Encounter for gynecological examination (general) (routine) with abnormal findings: Secondary | ICD-10-CM | POA: Diagnosis not present

## 2019-02-12 ENCOUNTER — Inpatient Hospital Stay: Admit: 2019-02-12 | Payer: BLUE CROSS/BLUE SHIELD | Admitting: Specialist

## 2019-02-12 SURGERY — ARTHROSCOPY, KNEE
Anesthesia: General | Laterality: Right

## 2019-03-05 DIAGNOSIS — E559 Vitamin D deficiency, unspecified: Secondary | ICD-10-CM | POA: Diagnosis not present

## 2019-03-06 DIAGNOSIS — Z20828 Contact with and (suspected) exposure to other viral communicable diseases: Secondary | ICD-10-CM | POA: Diagnosis not present

## 2019-07-01 ENCOUNTER — Other Ambulatory Visit: Payer: Self-pay

## 2019-07-01 ENCOUNTER — Inpatient Hospital Stay: Payer: BC Managed Care – PPO | Attending: Hematology & Oncology | Admitting: Hematology & Oncology

## 2019-07-01 ENCOUNTER — Encounter: Payer: Self-pay | Admitting: Hematology & Oncology

## 2019-07-01 ENCOUNTER — Inpatient Hospital Stay: Payer: BC Managed Care – PPO

## 2019-07-01 ENCOUNTER — Ambulatory Visit (HOSPITAL_BASED_OUTPATIENT_CLINIC_OR_DEPARTMENT_OTHER)
Admission: RE | Admit: 2019-07-01 | Discharge: 2019-07-01 | Disposition: A | Discharge status: Home / Self Care | Payer: BC Managed Care – PPO | Source: Ambulatory Visit | Attending: Hematology & Oncology | Admitting: Hematology & Oncology

## 2019-07-01 VITALS — BP 125/80 | HR 108 | Temp 97.7°F | Resp 20 | Wt 109.0 lb

## 2019-07-01 DIAGNOSIS — D693 Immune thrombocytopenic purpura: Secondary | ICD-10-CM

## 2019-07-01 DIAGNOSIS — D696 Thrombocytopenia, unspecified: Secondary | ICD-10-CM | POA: Insufficient documentation

## 2019-07-01 DIAGNOSIS — Z79899 Other long term (current) drug therapy: Secondary | ICD-10-CM | POA: Insufficient documentation

## 2019-07-01 DIAGNOSIS — R110 Nausea: Secondary | ICD-10-CM | POA: Insufficient documentation

## 2019-07-01 DIAGNOSIS — R079 Chest pain, unspecified: Secondary | ICD-10-CM | POA: Insufficient documentation

## 2019-07-01 LAB — CBC WITH DIFFERENTIAL (CANCER CENTER ONLY)
Abs Immature Granulocytes: 0.01 10*3/uL (ref 0.00–0.07)
Basophils Absolute: 0 10*3/uL (ref 0.0–0.1)
Basophils Relative: 0 %
Eosinophils Absolute: 0.1 10*3/uL (ref 0.0–0.5)
Eosinophils Relative: 1 %
HCT: 41.4 % (ref 36.0–46.0)
Hemoglobin: 13.8 g/dL (ref 12.0–15.0)
Immature Granulocytes: 0 %
Lymphocytes Relative: 31 %
Lymphs Abs: 1.5 10*3/uL (ref 0.7–4.0)
MCH: 31.1 pg (ref 26.0–34.0)
MCHC: 33.3 g/dL (ref 30.0–36.0)
MCV: 93.2 fL (ref 80.0–100.0)
Monocytes Absolute: 0.3 10*3/uL (ref 0.1–1.0)
Monocytes Relative: 7 %
Neutro Abs: 2.9 10*3/uL (ref 1.7–7.7)
Neutrophils Relative %: 61 %
Platelet Count: 119 10*3/uL — ABNORMAL LOW (ref 150–400)
RBC: 4.44 MIL/uL (ref 3.87–5.11)
RDW: 11.7 % (ref 11.5–15.5)
WBC Count: 4.8 10*3/uL (ref 4.0–10.5)
nRBC: 0 % (ref 0.0–0.2)

## 2019-07-01 LAB — SAVE SMEAR(SSMR), FOR PROVIDER SLIDE REVIEW

## 2019-07-01 NOTE — Progress Notes (Signed)
Hematology and Oncology Follow Up Visit  Nancy Wallace [id number] October 19, 1995 23 y.o. 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  She is doing quite well.  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  She is doing okay with this although she would much rather be in school.  The big news is that she now has a new puppy.  He is 20 months old.  He is very cute.  I saw pictures of him.  Her only complaint is that she has been having some chest pain.  There is no cough.  There is no shortness of breath.  Is seems to be on the left anterior chest.  Is seems to radiate little bit to the back.  She has had no fever with it.  We will get a chest x-ray on her to make sure there is nothing going on.  She is not a smoker.  She still has her monthly cycles.  They are fairly regular.  There is been no nausea or vomiting.  She has had no rashes.  There is been no leg swelling.  She has had no bruises.  Overall, her performance status is ECOG 0   Medications:  Current Outpatient Medications:  .  ADDERALL XR 15 MG 24 hr capsule, daily. , Disp: , Rfl: 0 .  amphetamine-dextroamphetamine (ADDERALL) 10 MG tablet, Take 10 mg by mouth daily. , Disp: , Rfl: 0 .  VITAMIN D, ERGOCALCIFEROL, PO, 1,000 Units daily. , Disp: , Rfl: 0  Allergies: No Known Allergies  Past Medical History, Surgical history, Social history, and Family History were reviewed and updated.  Review of Systems: Review of Systems  Constitutional: Negative.   HENT: Negative.   Eyes: Negative.   Respiratory: Negative.   Cardiovascular: Negative.   Gastrointestinal: Positive for nausea.  Genitourinary: Negative.   Musculoskeletal: Negative.   Skin: Negative.   Neurological: Negative.   Endo/Heme/Allergies: Negative.   Psychiatric/Behavioral: Negative.      Physical Exam:  weight is  109 lb (49.4 kg). Her temporal temperature is 97.7 F (36.5 C). Her blood pressure is 125/80 and her pulse is 108 (abnormal). Her respiration is 20 and oxygen saturation is 100%.   Wt Readings from Last 3 Encounters:  07/01/19 109 lb (49.4 kg)  06/30/18 111 lb 4 oz (50.5 kg)  06/01/18 112 lb 6.4 oz (51 kg)     Physical Exam Vitals reviewed.  HENT:     Head: Normocephalic and atraumatic.  Eyes:     Pupils: Pupils are equal, Wallace, and reactive to light.  Cardiovascular:     Rate and Rhythm: Normal rate and regular rhythm.     Heart sounds: Normal heart sounds.  Pulmonary:     Effort: Pulmonary effort is normal.     Breath sounds: Normal breath sounds.  Abdominal:     General: Bowel sounds are normal.     Palpations: Abdomen is soft.  Musculoskeletal:        General: No tenderness or deformity. Normal range of motion.     Cervical back: Normal range of motion.  Lymphadenopathy:     Cervical: No cervical adenopathy.  Skin:    General: Skin is warm and dry.     Findings: No erythema or rash.  Neurological:     Mental Status: She is alert and oriented to person, place, and time.  Psychiatric:        Behavior: Behavior normal.        Thought Content: Thought content normal.        Judgment: Judgment normal.      Lab Results  Component Value Date   WBC 4.8 07/01/2019   HGB 13.8 07/01/2019   HCT 41.4 07/01/2019   MCV 93.2 07/01/2019   PLT 119 (L) 07/01/2019     Chemistry   No results found for: NA, K, CL, CO2, BUN, CREATININE, GLU No results found for: CALCIUM, ALKPHOS, AST, ALT, BILITOT       Impression and Plan: [name] is a 23 year old white female. She has mild thrombocytopenia. Again I have to believe that this is immune-based.  I looked at her blood under the microscope.  I really was not too impressed.  Her platelets looked large.  Platelets were well granulated.  We will see what the chest x-ray shows.  Hopefully, we will not find anything that  warrants a CT scan or something invasive.  We will still plan to get her back in 1 year.  I think this would be very reasonable.  If there is anything on the chest x-ray, then we will certainly get her back sooner.   Volanda Napoleon, MD 12/16/20204:33 PM

## 2019-07-02 ENCOUNTER — Encounter: Payer: Self-pay | Admitting: *Deleted

## 2019-08-14 DIAGNOSIS — H5213 Myopia, bilateral: Secondary | ICD-10-CM | POA: Diagnosis not present

## 2019-08-14 DIAGNOSIS — H1789 Other corneal scars and opacities: Secondary | ICD-10-CM | POA: Diagnosis not present

## 2019-08-14 DIAGNOSIS — H52203 Unspecified astigmatism, bilateral: Secondary | ICD-10-CM | POA: Diagnosis not present

## 2019-09-04 DIAGNOSIS — F909 Attention-deficit hyperactivity disorder, unspecified type: Secondary | ICD-10-CM | POA: Diagnosis not present

## 2019-09-04 DIAGNOSIS — K219 Gastro-esophageal reflux disease without esophagitis: Secondary | ICD-10-CM | POA: Diagnosis not present

## 2019-09-04 DIAGNOSIS — E559 Vitamin D deficiency, unspecified: Secondary | ICD-10-CM | POA: Diagnosis not present

## 2020-01-26 DIAGNOSIS — G4700 Insomnia, unspecified: Secondary | ICD-10-CM | POA: Diagnosis not present

## 2020-03-01 DIAGNOSIS — Z111 Encounter for screening for respiratory tuberculosis: Secondary | ICD-10-CM | POA: Diagnosis not present

## 2020-03-09 DIAGNOSIS — E559 Vitamin D deficiency, unspecified: Secondary | ICD-10-CM | POA: Diagnosis not present

## 2020-04-10 DIAGNOSIS — Z20828 Contact with and (suspected) exposure to other viral communicable diseases: Secondary | ICD-10-CM | POA: Diagnosis not present

## 2020-05-05 DIAGNOSIS — Z20822 Contact with and (suspected) exposure to covid-19: Secondary | ICD-10-CM | POA: Diagnosis not present

## 2020-05-27 DIAGNOSIS — F39 Unspecified mood [affective] disorder: Secondary | ICD-10-CM | POA: Diagnosis not present

## 2020-05-27 DIAGNOSIS — J3489 Other specified disorders of nose and nasal sinuses: Secondary | ICD-10-CM | POA: Diagnosis not present

## 2020-05-27 DIAGNOSIS — F909 Attention-deficit hyperactivity disorder, unspecified type: Secondary | ICD-10-CM | POA: Diagnosis not present

## 2020-06-22 DIAGNOSIS — F39 Unspecified mood [affective] disorder: Secondary | ICD-10-CM | POA: Diagnosis not present

## 2020-06-22 DIAGNOSIS — F909 Attention-deficit hyperactivity disorder, unspecified type: Secondary | ICD-10-CM | POA: Diagnosis not present

## 2020-06-26 ENCOUNTER — Other Ambulatory Visit: Payer: Self-pay

## 2020-06-26 ENCOUNTER — Encounter (HOSPITAL_COMMUNITY): Payer: Self-pay | Admitting: *Deleted

## 2020-06-26 ENCOUNTER — Emergency Department (HOSPITAL_COMMUNITY): Payer: BC Managed Care – PPO

## 2020-06-26 ENCOUNTER — Emergency Department (HOSPITAL_COMMUNITY)
Admission: EM | Admit: 2020-06-26 | Discharge: 2020-06-26 | Disposition: A | Discharge status: Home / Self Care | Payer: BC Managed Care – PPO | Attending: Emergency Medicine | Admitting: Emergency Medicine

## 2020-06-26 DIAGNOSIS — R0789 Other chest pain: Secondary | ICD-10-CM | POA: Insufficient documentation

## 2020-06-26 DIAGNOSIS — R42 Dizziness and giddiness: Secondary | ICD-10-CM | POA: Insufficient documentation

## 2020-06-26 DIAGNOSIS — F419 Anxiety disorder, unspecified: Secondary | ICD-10-CM | POA: Insufficient documentation

## 2020-06-26 DIAGNOSIS — R002 Palpitations: Secondary | ICD-10-CM | POA: Insufficient documentation

## 2020-06-26 DIAGNOSIS — R000 Tachycardia, unspecified: Secondary | ICD-10-CM | POA: Insufficient documentation

## 2020-06-26 DIAGNOSIS — R0602 Shortness of breath: Secondary | ICD-10-CM | POA: Insufficient documentation

## 2020-06-26 HISTORY — DX: Personal history of diseases of the blood and blood-forming organs and certain disorders involving the immune mechanism: Z86.2

## 2020-06-26 HISTORY — DX: Vitamin D deficiency, unspecified: E55.9

## 2020-06-26 HISTORY — DX: Anxiety disorder, unspecified: F41.9

## 2020-06-26 LAB — CBC WITH DIFFERENTIAL/PLATELET
Abs Immature Granulocytes: 0.02 10*3/uL (ref 0.00–0.07)
Basophils Absolute: 0 10*3/uL (ref 0.0–0.1)
Basophils Relative: 1 %
Eosinophils Absolute: 0 10*3/uL (ref 0.0–0.5)
Eosinophils Relative: 1 %
HCT: 41.4 % (ref 36.0–46.0)
Hemoglobin: 14.1 g/dL (ref 12.0–15.0)
Immature Granulocytes: 0 %
Lymphocytes Relative: 34 %
Lymphs Abs: 2.2 10*3/uL (ref 0.7–4.0)
MCH: 31.8 pg (ref 26.0–34.0)
MCHC: 34.1 g/dL (ref 30.0–36.0)
MCV: 93.2 fL (ref 80.0–100.0)
Monocytes Absolute: 0.5 10*3/uL (ref 0.1–1.0)
Monocytes Relative: 7 %
Neutro Abs: 3.7 10*3/uL (ref 1.7–7.7)
Neutrophils Relative %: 57 %
Platelets: 138 10*3/uL — ABNORMAL LOW (ref 150–400)
RBC: 4.44 MIL/uL (ref 3.87–5.11)
RDW: 11.9 % (ref 11.5–15.5)
WBC: 6.4 10*3/uL (ref 4.0–10.5)
nRBC: 0 % (ref 0.0–0.2)

## 2020-06-26 LAB — BASIC METABOLIC PANEL
Anion gap: 11 (ref 5–15)
BUN: 12 mg/dL (ref 6–20)
CO2: 22 mmol/L (ref 22–32)
Calcium: 8.9 mg/dL (ref 8.9–10.3)
Chloride: 104 mmol/L (ref 98–111)
Creatinine, Ser: 0.69 mg/dL (ref 0.44–1.00)
GFR, Estimated: >60 mL/min (ref >60)
Glucose, Bld: 101 mg/dL — ABNORMAL HIGH (ref 70–99)
Potassium: 3.6 mmol/L (ref 3.5–5.1)
Sodium: 137 mmol/L (ref 135–145)

## 2020-06-26 LAB — D-DIMER, QUANTITATIVE: D-Dimer, Quant: <0.27 ug/mL-FEU (ref 0.00–0.50)

## 2020-06-26 LAB — I-STAT BETA HCG BLOOD, ED (MC, WL, AP ONLY): I-stat hCG, quantitative: <5 m[IU]/mL (ref <5)

## 2020-06-26 LAB — TSH: TSH: 2.586 u[IU]/mL (ref 0.350–4.500)

## 2020-06-26 NOTE — ED Provider Notes (Signed)
[facility] Provider Note   CSN: [id number] Arrival date & time: 06/26/20  1614     History Chief Complaint  Patient presents with  . Shortness of Breath  . Dizziness    Nancy Wallace is a 24 y.o. female presenting for evaluation of shortness of breath, dizziness, chest pressure and palpitations.  Patient states yesterday she developed symptoms of dizziness, which she describes as lightheadedness.  She also had racing heartbeat, associated shortness of breath and chest pressure.  She got a phone app that told her she was in A. Fib. Pt states her sxs improved slightly by yesterday evening, and she felt well when she woke this am. Just before lunch, pt became lightheaded again, and once again was wold she was in afib. She then felt her heart was racing and had chest pressure. She denies recent fevers, chills, cough, nausea, vomiting abdominal pain, urinary symptoms, normal bowel movements, leg pain and swelling.  She denies recent travel, surgeries, immobilization, history of cancer, history of previous DVT/PE, hormone use.  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  She recently had a dental cath placed with subsequent inflammation.  To start on ibuprofen and developed a rash.  This occurred approximately 3 weeks ago.  Her last period was 2 weeks ago, slightly longer/heavier than normal.  She denies tobacco use.  Reports occasional alcohol use, none recently.  She uses CBD and smokes weed occasionally, none recently.  No other drug use.  She does have a history of anxiety, but states her symptoms feel different than her normal anxiety symptoms.  She is on Adderall for ADHD, had a dose increase 1 month ago. No other medications  HPI     Past Medical History:  Diagnosis Date  . Anxiety   . History of ITP   . Vitamin D deficiency     Patient Active Problem List   Diagnosis Date Noted  . 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 No pertinent surgical history.   OB History   No obstetric history on file.     No family history on file.  Social History   Tobacco Use  . Smoking status: Never Smoker  . Smokeless tobacco: Never Used  Vaping Use  . Vaping Use: Never used  Substance Use Topics  . Alcohol use: Yes    Home Medications Prior to Admission medications   Medication Sig Start Date End Date Taking? Authorizing Provider  ADDERALL XR 15 MG 24 hr capsule daily.  11/01/16   [provider]  amphetamine-dextroamphetamine (ADDERALL) 10 MG tablet Take 10 mg by mouth daily.  05/29/17   [provider]  VITAMIN D, ERGOCALCIFEROL, PO 1,000 Units daily.  09/12/16   [provider]    Allergies    Patient has no known allergies.  Review of Systems   Review of Systems  Respiratory: Positive for chest tightness and shortness of breath.   Cardiovascular: Positive for palpitations.  Neurological: Positive for light-headedness.  All other systems reviewed and are negative.   Physical Exam Updated Vital Signs BP (!) 121/103   Pulse (!) 109   Temp 97.8 F (36.6 C) (Oral)   Resp 18   Ht 5\' 6"  (1.676 m)   LMP 06/09/2020   SpO2 98%   BMI 17.59 kg/m   Physical Exam Vitals and nursing note reviewed.  Constitutional:      General: She is not in acute distress.    Appearance:  She is well-developed and well-nourished.     Comments: Resting in the bed in NAD  HENT:     Head: Normocephalic and atraumatic.  Eyes:     Extraocular Movements: Extraocular movements intact and EOM normal.     Conjunctiva/sclera: Conjunctivae normal.     Pupils: Pupils are equal, round, and reactive to light.  Cardiovascular:     Rate and Rhythm: Regular rhythm. Tachycardia present.     Pulses: Normal pulses and intact distal pulses.     Comments: HR between 110 and 120 Pulmonary:     Effort: Pulmonary effort is normal. No respiratory distress.     Breath sounds: Normal breath sounds. No  wheezing.     Comments: Clear lung sounds Abdominal:     General: There is no distension.     Palpations: Abdomen is soft. There is no mass.     Tenderness: There is no abdominal tenderness. There is no guarding or rebound.  Musculoskeletal:        General: Normal range of motion.     Cervical back: Normal range of motion and neck supple.     Right lower leg: No edema.     Left lower leg: No edema.  Skin:    General: Skin is warm and dry.     Capillary Refill: Capillary refill takes less than 2 seconds.  Neurological:     Mental Status: She is alert and oriented to person, place, and time.  Psychiatric:        Mood and Affect: Mood is anxious.     Comments: Pt appears anxious     ED Results / Procedures / Treatments   Labs (all labs ordered are listed, but only abnormal results are displayed) Labs Reviewed  CBC WITH DIFFERENTIAL/PLATELET - Abnormal; Notable for the following components:      Result Value   Platelets 138 (*)    All other components within normal limits  BASIC METABOLIC PANEL - Abnormal; Notable for the following components:   Glucose, Bld 101 (*)    All other components within normal limits  D-DIMER, QUANTITATIVE (NOT AT [facility])  TSH  I-STAT BETA HCG BLOOD, ED ([facility], [facility], [facility] ONLY)    EKG None  Radiology DG Chest 2 View  Result Date: 06/26/2020 CLINICAL DATA:  Shortness of breath, dizziness with chest pressure and palpitations EXAM: CHEST - 2 VIEW COMPARISON:  Radiograph 07/01/2019 FINDINGS: No consolidation, features of edema, pneumothorax, or effusion. Pulmonary vascularity is normally distributed. The cardiomediastinal contours are unremarkable. No acute osseous or soft tissue abnormality. Levocurvature of the spine is similar to prior. IMPRESSION: No acute cardiopulmonary abnormality. Electronically Signed   By: Kreg Shropshire M.D.   On: 06/26/2020 17:37    Procedures Procedures (including critical care time)  Medications Ordered in ED Medications - No  data to display  ED Course  I have reviewed the triage vital signs and the nursing notes.  Pertinent labs & imaging results that were available during my care of the patient were reviewed by me and considered in my medical decision making (see chart for details).    MDM Rules/Calculators/A&P                          Pt presenting for evaluation of lightheadedness, palpitations, and chest pressure. On exam, pt appears nontoxic. She is mildly tachycardic, HR 110-120. She also appears anxious. As sxs improved yesterday afternoon and then returned again, likely anxiety. However in  the setting in h/o ITP and recent long menstruation and rash, will check cbc/plts. Also consider PE, although pt is low risk. ekg obtained from triage shows sinus tach, no afib. cxr obtained from triage viewed and interpreted by me, no pna, pnx, effusion, cardiomegaly. discussed with pt and mom that if w/u is negative, likely anxiety as cause, but pt may benefit from cards f/u as needed.   Labs interpreted by me, overall reassuring.  Hemoglobin stable.  Platelets slightly low at 138, but at patient's baseline.  Dimer negative.  Discussed findings with patient and mom.  Discussed likely anxiety as cause.  Offered hydroxyzine to take as needed, patient has appoint with her PCP in 4 days and will follow up with them regarding that.  Discussed cardiology follow-up as needed.  At this time, patient appears safe for discharge.  Return precautions given.  Patient states she understands and agrees to plan.  Final Clinical Impression(s) / ED Diagnoses Final diagnoses:  Tachycardia  Lightheadedness    Rx / DC Orders ED Discharge Orders    None       Alveria Apley, PA-C 06/26/20 2138    Margarita Grizzle, MD 06/26/20 2236

## 2020-06-26 NOTE — Discharge Instructions (Signed)
Your work-up today was overall reassuring.  Your blood counts were normal.  Your test for blood clot was negative. Make sure you are staying well-hydrated water. Follow-up with your primary care doctor at your scheduled appointment this week. Call the cardiology office listed below to set up a follow-up appointment for further evaluation of your tachycardia. Return to the emergency room with any new, worsening, concerning symptoms.

## 2020-06-26 NOTE — ED Triage Notes (Signed)
Pt states she has been having shob and heart problems for years, states she has anxiety and is on Adderall, felt dizzy, tingling in body during [facility] and dizziness along with some chest pressure. PT has never been dx with any heart abnormalities or  heart disease.

## 2020-06-30 ENCOUNTER — Other Ambulatory Visit: Payer: Self-pay

## 2020-06-30 ENCOUNTER — Inpatient Hospital Stay (HOSPITAL_BASED_OUTPATIENT_CLINIC_OR_DEPARTMENT_OTHER): Payer: BC Managed Care – PPO | Admitting: Hematology & Oncology

## 2020-06-30 ENCOUNTER — Inpatient Hospital Stay: Payer: BC Managed Care – PPO | Attending: Hematology & Oncology

## 2020-06-30 ENCOUNTER — Telehealth: Payer: Self-pay

## 2020-06-30 VITALS — BP 129/76 | HR 84 | Temp 98.7°F | Resp 18 | Wt 110.0 lb

## 2020-06-30 DIAGNOSIS — R000 Tachycardia, unspecified: Secondary | ICD-10-CM | POA: Insufficient documentation

## 2020-06-30 DIAGNOSIS — D693 Immune thrombocytopenic purpura: Secondary | ICD-10-CM | POA: Diagnosis not present

## 2020-06-30 DIAGNOSIS — D696 Thrombocytopenia, unspecified: Secondary | ICD-10-CM | POA: Insufficient documentation

## 2020-06-30 DIAGNOSIS — R110 Nausea: Secondary | ICD-10-CM | POA: Insufficient documentation

## 2020-06-30 DIAGNOSIS — I341 Nonrheumatic mitral (valve) prolapse: Secondary | ICD-10-CM | POA: Diagnosis not present

## 2020-06-30 DIAGNOSIS — Z79899 Other long term (current) drug therapy: Secondary | ICD-10-CM | POA: Insufficient documentation

## 2020-06-30 DIAGNOSIS — R002 Palpitations: Secondary | ICD-10-CM | POA: Diagnosis not present

## 2020-06-30 LAB — CBC WITH DIFFERENTIAL (CANCER CENTER ONLY)
Abs Immature Granulocytes: 0.01 10*3/uL (ref 0.00–0.07)
Basophils Absolute: 0 10*3/uL (ref 0.0–0.1)
Basophils Relative: 1 %
Eosinophils Absolute: 0.1 10*3/uL (ref 0.0–0.5)
Eosinophils Relative: 1 %
HCT: 39.7 % (ref 36.0–46.0)
Hemoglobin: 13.1 g/dL (ref 12.0–15.0)
Immature Granulocytes: 0 %
Lymphocytes Relative: 38 %
Lymphs Abs: 1.5 10*3/uL (ref 0.7–4.0)
MCH: 30.8 pg (ref 26.0–34.0)
MCHC: 33 g/dL (ref 30.0–36.0)
MCV: 93.2 fL (ref 80.0–100.0)
Monocytes Absolute: 0.3 10*3/uL (ref 0.1–1.0)
Monocytes Relative: 7 %
Neutro Abs: 2 10*3/uL (ref 1.7–7.7)
Neutrophils Relative %: 53 %
Platelet Count: 106 10*3/uL — ABNORMAL LOW (ref 150–400)
RBC: 4.26 MIL/uL (ref 3.87–5.11)
RDW: 11.9 % (ref 11.5–15.5)
WBC Count: 3.8 10*3/uL — ABNORMAL LOW (ref 4.0–10.5)
nRBC: 0 % (ref 0.0–0.2)

## 2020-06-30 LAB — PLATELET BY CITRATE

## 2020-06-30 NOTE — Progress Notes (Signed)
Hematology and Oncology Follow Up Visit  Nancy Wallace [id number] 08/22/1995 24 y.o. 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  We see her yearly.  She actually had some problems recently.  She had to go the emergency room a few days ago.  She was having some palpitations.  She had a little anxiety.  She had an EKG which showed sinus tachycardia.  Her labs looked okay.  There is no hyperthyroidism.  I am unsure if she will be referred to a cardiologist.  Otherwise, she is very busy at school.  She has 1 more semester left.  She actually took a 7 classes this semester.  She has 7 more classes next semester.  She has had no issues with bleeding.  She has had some bruising.  She had an episode of petechia on her left side.  She has had no fever.  She has had the coronavirus vaccines.  She has had no change in bowel or bladder habits.  She has had no leg swelling.  Currently, her appetite is doing quite well.  Again she is really focused on school so she sometimes gets off track a little bit with her nutrition.  I am not surprised by this but she definitely looks very fit and I think tries to stay active.  Currently, her performance status is ECOG 1.    Medications:  Current Outpatient Medications:  .  buPROPion (WELLBUTRIN XL) 150 MG 24 hr tablet, Take 150 mg by mouth daily., Disp: , Rfl:  .  ADDERALL XR 20 MG 24 hr capsule, Take 20 mg by mouth every morning., Disp: , Rfl:  .  amphetamine-dextroamphetamine (ADDERALL) 10 MG tablet, Take 10 mg by mouth daily. , Disp: , Rfl: 0 .  COVID-19 Specimen Collection KIT, See admin instructions. for testing, Disp: , Rfl:   Allergies: No Known Allergies  Past Medical History, Surgical history, Social history, and Family History were reviewed and updated.  Review of Systems: Review of Systems   Constitutional: Negative.   HENT: Negative.   Eyes: Negative.   Respiratory: Negative.   Cardiovascular: Negative.   Gastrointestinal: Positive for nausea.  Genitourinary: Negative.   Musculoskeletal: Negative.   Skin: Negative.   Neurological: Negative.   Endo/Heme/Allergies: Negative.   Psychiatric/Behavioral: Negative.      Physical Exam:  weight is 110 lb (49.9 kg). Her oral temperature is 98.7 F (37.1 C). Her blood pressure is 129/76 and her pulse is 84. Her respiration is 18 and oxygen saturation is 100%.   Wt Readings from Last 3 Encounters:  06/30/20 110 lb (49.9 kg)  07/01/19 109 lb (49.4 kg)  06/30/18 111 lb 4 oz (50.5 kg)     Physical Exam Vitals reviewed.  HENT:     Head: Normocephalic and atraumatic.  Eyes:     Pupils: Pupils are equal, round, and reactive to light.  Cardiovascular:     Rate and Rhythm: Normal rate and regular rhythm.     Heart sounds: Normal heart sounds.  Pulmonary:     Effort: Pulmonary effort is normal.     Breath sounds: Normal breath sounds.  Abdominal:     General: Bowel sounds are normal.     Palpations: Abdomen is soft.  Musculoskeletal:        General: No tenderness or deformity. Normal range of motion.     Cervical  back: Normal range of motion.  Lymphadenopathy:     Cervical: No cervical adenopathy.  Skin:    General: Skin is warm and dry.     Findings: No erythema or rash.  Neurological:     Mental Status: She is alert and oriented to person, place, and time.  Psychiatric:        Behavior: Behavior normal.        Thought Content: Thought content normal.        Judgment: Judgment normal.      Lab Results  Component Value Date   WBC 3.8 (L) 06/30/2020   HGB 13.1 06/30/2020   HCT 39.7 06/30/2020   MCV 93.2 06/30/2020   PLT 106 (L) 06/30/2020     Chemistry      Component Value Date/Time   NA 137 06/26/2020 2042   K 3.6 06/26/2020 2042   CL 104 06/26/2020 2042   CO2 22 06/26/2020 2042   BUN 12 06/26/2020  2042   CREATININE 0.69 06/26/2020 2042      Component Value Date/Time   CALCIUM 8.9 06/26/2020 2042      Impression and Plan: Nancy Wallace is a 24 year old white female. She has mild thrombocytopenia.  Her platelet count is down a little bit today.  I am not sure exactly what this indicates but is some we will just have to watch.  Again I have to believe that this is immune-based.  I looked at her blood under the microscope.  I really was not too impressed.  Her platelets looked large.  Platelets were well granulated.  I will go ahead and get a echocardiogram on her.  1 thing I thought about was mitral valve prolapse.  We will see if this might be an issue as to why she has this tachycardia.  I still think we can get her back in 1 year.  Nancy Napoleon, MD 12/16/20213:22 PM

## 2020-06-30 NOTE — Telephone Encounter (Signed)
appts made for pt per 06/30/20 los, pt req not to be printed... AOM

## 2020-07-06 ENCOUNTER — Telehealth: Payer: Self-pay

## 2020-07-06 NOTE — Telephone Encounter (Signed)
Pt has been scheduled for echo for 07/11/20, per tara this has been approved an ok to schedule(note this order was not in 06/30/20 los).  Left a message for the pt and advised to call if this schedule is not convenient.Marland Kitchen AOM

## 2020-07-07 ENCOUNTER — Other Ambulatory Visit: Payer: Self-pay

## 2020-07-07 ENCOUNTER — Ambulatory Visit (HOSPITAL_COMMUNITY)
Admission: RE | Admit: 2020-07-07 | Discharge: 2020-07-07 | Disposition: A | Discharge status: Home / Self Care | Payer: BC Managed Care – PPO | Source: Ambulatory Visit | Attending: Hematology & Oncology | Admitting: Hematology & Oncology

## 2020-07-07 ENCOUNTER — Encounter: Payer: Self-pay | Admitting: *Deleted

## 2020-07-07 DIAGNOSIS — R9431 Abnormal electrocardiogram [ECG] [EKG]: Secondary | ICD-10-CM | POA: Insufficient documentation

## 2020-07-07 DIAGNOSIS — R000 Tachycardia, unspecified: Secondary | ICD-10-CM | POA: Diagnosis not present

## 2020-07-07 LAB — ECHOCARDIOGRAM COMPLETE
Area-P 1/2: 3.65 cm2
S' Lateral: 2.5 cm

## 2020-07-07 NOTE — Progress Notes (Signed)
  Echocardiogram 2D Echocardiogram has been performed.  Augustine Radar 07/07/2020, 9:57 AM

## 2020-07-12 ENCOUNTER — Other Ambulatory Visit (HOSPITAL_COMMUNITY): Payer: BC Managed Care – PPO

## 2020-08-04 ENCOUNTER — Encounter: Payer: Self-pay | Admitting: Hematology & Oncology

## 2020-09-05 DIAGNOSIS — F909 Attention-deficit hyperactivity disorder, unspecified type: Secondary | ICD-10-CM | POA: Diagnosis not present

## 2020-09-05 DIAGNOSIS — D696 Thrombocytopenia, unspecified: Secondary | ICD-10-CM | POA: Diagnosis not present

## 2020-09-05 DIAGNOSIS — F418 Other specified anxiety disorders: Secondary | ICD-10-CM | POA: Diagnosis not present

## 2020-09-05 DIAGNOSIS — R000 Tachycardia, unspecified: Secondary | ICD-10-CM | POA: Diagnosis not present

## 2020-09-13 ENCOUNTER — Other Ambulatory Visit: Payer: Self-pay

## 2020-09-13 ENCOUNTER — Ambulatory Visit (INDEPENDENT_AMBULATORY_CARE_PROVIDER_SITE_OTHER): Payer: BC Managed Care – PPO | Admitting: Internal Medicine

## 2020-09-13 ENCOUNTER — Ambulatory Visit: Payer: BC Managed Care – PPO

## 2020-09-13 ENCOUNTER — Encounter: Payer: Self-pay | Admitting: Internal Medicine

## 2020-09-13 VITALS — BP 112/68 | HR 92 | Ht 66.0 in | Wt 107.8 lb

## 2020-09-13 DIAGNOSIS — R002 Palpitations: Secondary | ICD-10-CM

## 2020-09-13 NOTE — Progress Notes (Signed)
Cardiology Office Note:    Date:  09/13/2020   ID:  Nancy Wallace, DOB 1995/09/09, MRN [id number]  PCP:  Kelton Pillar, [place]  Cardiologist:  No primary care provider on file.  Advanced Practice Provider:  No care team member to display Electrophysiologist:  None       CC: Tachycardia Consulted for the evaluation of palpitations at the behest of Kelton Pillar, MD  History of Present Illness:    Nancy Wallace is a 25 y.o. female with a hx of ITP, anxiety who presents for evaluation 09/13/20.  Patient notes that she is feeling tachycardiac.  Notes that she has sudden onset chest pressure and tightness.  Feels like her throat is clothing and can't get a good breath.   Discomfort occurs with spontaneously , worsens with no triggers, and improves with no intervention.  Patient exertion notable for walking most weekends at least Five mild and chases her dog  and feels no symptoms.  No shortness of breath, DOE .  No PND or orthopnea.  Notes bendopnea, no weight gain, leg swelling , or abdominal swelling.    Notes that she has had palpitations for a while:  A least a few years.  Lately they have got a lot worse.  Thought they were anxiety, but weren't anxious with them  Patient notes that her Adderall has increased with Wellbutrin and was doing fine.  With palpitations stopped the stimulant medication.  Notes that she has three episodes in the last two weeks.  Notes near syncope with this:  Was walking to the restroom at the [facility], felt like she was very dizzy, and everything went a little black.  No history of pre-eclampsia, early menarche, unsure prematurity (adopted) .    Past Medical History:  Diagnosis Date  . Anxiety   . History of ITP   . Tachycardia   . Vitamin D deficiency     History reviewed. No pertinent surgical history.  Current Medications: Current Meds  Medication Sig  . ADDERALL XR 20 MG 24 hr capsule Take 20 mg by mouth every  morning.  Marland Kitchen amphetamine-dextroamphetamine (ADDERALL) 10 MG tablet Take 10 mg by mouth daily.   Marland Kitchen buPROPion (WELLBUTRIN XL) 150 MG 24 hr tablet Take 150 mg by mouth daily.  Marland Kitchen COVID-19 Specimen Collection KIT See admin instructions. for testing     Allergies:   Patient has no known allergies.   Social History   Socioeconomic History  . Marital status: Significant Other    Spouse name: Not on file  . Number of children: Not on file  . Years of education: Not on file  . Highest education level: Not on file  Occupational History  . Not on file  Tobacco Use  . Smoking status: Never Smoker  . Smokeless tobacco: Never Used  Vaping Use  . Vaping Use: Never used  Substance and Sexual Activity  . Alcohol use: Yes  . Drug use: Not on file    Comment: CBD  . Sexual activity: Not on file  Other Topics Concern  . Not on file  Social History Narrative  . Not on file   Social Determinants of Health   Financial Resource Strain: Not on file  Food Insecurity: Not on file  Transportation Needs: Not on file  Physical Activity: Not on file  Stress: Not on file  Social Connections: Not on file     Family History: Patient was adopted  ROS:   Please  see the history of present illness.    Notes bottom of her feet were number when running last week.  All other systems reviewed and are negative.  EKGs/Labs/Other Studies Reviewed:    The following studies were reviewed today:  EKG:   06/27/20:  Sinus Tachy 113  Transthoracic Echocardiogram: Date: 07/07/20 Results: 1. Left ventricular ejection fraction, by estimation, is 65 to 70%. The  left ventricle has normal function. The left ventricle has no regional  wall motion abnormalities. Left ventricular diastolic parameters were  normal.  2. Right ventricular systolic function is normal. The right ventricular  size is normal. There is normal pulmonary artery systolic pressure.  3. The mitral valve is normal in structure. Trivial  mitral valve  regurgitation.  4. The aortic valve is tricuspid. Aortic valve regurgitation is not  visualized.  5. The inferior vena cava is normal in size with greater than 50%  respiratory variability, suggesting right atrial pressure of 3 mmHg.   Recent Labs: 06/26/2020: BUN 12; Creatinine, Ser 0.69; Potassium 3.6; Sodium 137; TSH 2.586 06/30/2020: Hemoglobin 13.1; Platelet Count 106  Recent Lipid Panel No results found for: CHOL, TRIG, HDL, CHOLHDL, VLDL, LDLCALC, LDLDIRECT   Risk Assessment/Calculations:     N/A  Physical Exam:    VS:  BP 112/68   Pulse 92   Ht $R'5\' 6"'Sf$  (1.676 m)   Wt 107 lb 12.8 oz (48.9 kg)   SpO2 98%   BMI 17.40 kg/m     Orthostatic Vitals: Supine:  BP 110/62  HR 88 Sitting:  BP 112/68  HR 92 Standing:  BP 102/62  HR 95 Prolonged Stand:  BP 106/64  HR 98   Wt Readings from Last 3 Encounters:  09/13/20 107 lb 12.8 oz (48.9 kg)  06/30/20 110 lb (49.9 kg)  07/01/19 109 lb (49.4 kg)     GEN:  Well nourished, well developed in no acute distress HEENT: Normal NECK: No JVD; No carotid bruits LYMPHATICS: No lymphadenopathy CARDIAC: RRR, no murmurs, rubs, gallops RESPIRATORY:  Clear to auscultation without rales, wheezing or rhonchi  ABDOMEN: Soft, non-tender, non-distended MUSCULOSKELETAL:  No edema; No deformity  SKIN: Warm and dry NEUROLOGIC:  Alert and oriented x 3 PSYCHIATRIC:  Normal affect   ASSESSMENT:    1. Palpitations    PLAN:    In order of problems listed above:  Palpitations; possible SVT Near Syncope - will obtain 14-day non live heart monitor (ZioPatch) - Discussed SVT abortive maneuvers   Three months follow up unless new symptoms or abnormal test results warranting change in plan Would be reasonable for  APP Follow up        Medication Adjustments/Labs and Tests Ordered: Current medicines are reviewed at length with the patient today.  Concerns regarding medicines are outlined above.  Orders Placed This  Encounter  Procedures  . LONG TERM MONITOR (3-14 DAYS)   No orders of the defined types were placed in this encounter.   Patient Instructions  Medication Instructions:  Your physician recommends that you continue on your current medications as directed. Please refer to the Current Medication list given to you today.  *If you need a refill on your cardiac medications before your next appointment, please call your pharmacy*   Lab Work: NONE If you have labs (blood work) drawn today and your tests are completely normal, you will receive your results only by: Marland Kitchen MyChart Message (if you have MyChart) OR . A paper copy in the mail If you have any lab  test that is abnormal or we need to change your treatment, we will call you to review the results.   Testing/Procedures: Your physician has requested that you wear a heart monitor.    Follow-Up: At [facility], you and your health needs are our priority.  As part of our continuing mission to provide you with exceptional heart care, we have created designated Provider Care Teams.  These Care Teams include your primary Cardiologist (physician) and Advanced Practice Providers (APPs -  Physician Assistants and Nurse Practitioners) who all work together to provide you with the care you need, when you need it.  We recommend signing up for the patient portal called "MyChart".  Sign up information is provided on this After Visit Summary.  MyChart is used to connect with patients for Virtual Visits (Telemedicine).  Patients are able to view lab/test results, encounter notes, upcoming appointments, etc.  Non-urgent messages can be sent to your provider as well.   To learn more about what you can do with MyChart, go to [email or web address].    Your next appointment:   3 month(s)  The format for your next appointment:   In Person  Provider:   You may see Rudean Haskell, MD or one of the following Advanced Practice Providers on your  designated Care Team:    Melina Copa, PA-C  Ermalinda Barrios, PA-C    Other Instructions  Bryn Gulling- Long Term Monitor Instructions   Your physician has requested you wear your ZIO patch monitor___14___days.   This is a single patch monitor.  Irhythm supplies one patch monitor per enrollment.  Additional stickers are not available.   Please do not apply patch if you will be having a Nuclear Stress Test, Echocardiogram, Cardiac CT, MRI, or Chest Xray during the time frame you would be wearing the monitor. The patch cannot be worn during these tests.  You cannot remove and re-apply the ZIO XT patch monitor.   Your ZIO patch monitor will be sent USPS Priority mail from [facility] directly to your home address. The monitor may also be mailed to a PO BOX if home delivery is not available.   It may take 3-5 days to receive your monitor after you have been enrolled.   Once you have received you monitor, please review enclosed instructions.  Your monitor has already been registered assigning a specific monitor serial # to you.   Applying the monitor   Shave hair from upper left chest.   Hold abrader disc by orange tab.  Rub abrader in 40 strokes over left upper chest as indicated in your monitor instructions.   Clean area with 4 enclosed alcohol pads .  Use all pads to assure are is cleaned thoroughly.  Let dry.   Apply patch as indicated in monitor instructions.  Patch will be place under collarbone on left side of chest with arrow pointing upward.   Rub patch adhesive wings for 2 minutes.Remove white label marked "1".  Remove white label marked "2".  Rub patch adhesive wings for 2 additional minutes.   While looking in a mirror, press and release button in center of patch.  A small green light will flash 3-4 times .  This will be your only indicator the monitor has been turned on.     Do not shower for the first 24 hours.  You may shower after the first 24 hours.   Press button if  you feel a symptom. You will hear a small click.  Record  Date, Time and Symptom in the Patient Log Book.   When you are ready to remove patch, follow instructions on last 2 pages of Patient Log Book.  Stick patch monitor onto last page of Patient Log Book.   Place Patient Log Book in [place] box.  Use locking tab on box and tape box closed securely.  The Orange and [employer] has [employer] on it.  Please place in mailbox as soon as possible.  Your physician should have your test results approximately 7 days after the monitor has been mailed back to [facility].   Call [facility] at [phone] if you have questions regarding your ZIO XT patch monitor.  Call them immediately if you see an orange light blinking on your monitor.   If your monitor falls off in less than 4 days contact our Monitor department at [phone].  If your monitor becomes loose or falls off after 4 days call [employer] at [phone] for suggestions on securing your monitor.       Signed, Werner Lean, MD  09/13/2020 9:25 AM    [facility]

## 2020-09-13 NOTE — Patient Instructions (Signed)
Medication Instructions:  Your physician recommends that you continue on your current medications as directed. Please refer to the Current Medication list given to you today.  *If you need a refill on your cardiac medications before your next appointment, please call your pharmacy*   Lab Work: NONE If you have labs (blood work) drawn today and your tests are completely normal, you will receive your results only by: Marland Kitchen MyChart Message (if you have MyChart) OR . A paper copy in the mail If you have any lab test that is abnormal or we need to change your treatment, we will call you to review the results.   Testing/Procedures: Your physician has requested that you wear a heart monitor.    Follow-Up: At [facility], you and your health needs are our priority.  As part of our continuing mission to provide you with exceptional heart care, we have created designated Provider Care Teams.  These Care Teams include your primary Cardiologist (physician) and Advanced Practice Providers (APPs -  Physician Assistants and Nurse Practitioners) who all work together to provide you with the care you need, when you need it.  We recommend signing up for the patient portal called "MyChart".  Sign up information is provided on this After Visit Summary.  MyChart is used to connect with patients for Virtual Visits (Telemedicine).  Patients are able to view lab/test results, encounter notes, upcoming appointments, etc.  Non-urgent messages can be sent to your provider as well.   To learn more about what you can do with MyChart, go to [email or web address].    Your next appointment:   3 month(s)  The format for your next appointment:   In Person  Provider:   You may see Riley Lam, MD or one of the following Advanced Practice Providers on your designated Care Team:    Ronie Spies, PA-C  Jacolyn Reedy, PA-C    Other Instructions  Christena Deem- Long Term Monitor Instructions   Your physician has  requested you wear your ZIO patch monitor___14___days.   This is a single patch monitor.  Irhythm supplies one patch monitor per enrollment.  Additional stickers are not available.   Please do not apply patch if you will be having a Nuclear Stress Test, Echocardiogram, Cardiac CT, MRI, or Chest Xray during the time frame you would be wearing the monitor. The patch cannot be worn during these tests.  You cannot remove and re-apply the ZIO XT patch monitor.   Your ZIO patch monitor will be sent USPS Priority mail from [facility] directly to your home address. The monitor may also be mailed to a PO BOX if home delivery is not available.   It may take 3-5 days to receive your monitor after you have been enrolled.   Once you have received you monitor, please review enclosed instructions.  Your monitor has already been registered assigning a specific monitor serial # to you.   Applying the monitor   Shave hair from upper left chest.   Hold abrader disc by orange tab.  Rub abrader in 40 strokes over left upper chest as indicated in your monitor instructions.   Clean area with 4 enclosed alcohol pads .  Use all pads to assure are is cleaned thoroughly.  Let dry.   Apply patch as indicated in monitor instructions.  Patch will be place under collarbone on left side of chest with arrow pointing upward.   Rub patch adhesive wings for 2 minutes.Remove white label marked "1".  Remove  white label marked "2".  Rub patch adhesive wings for 2 additional minutes.   While looking in a mirror, press and release button in center of patch.  A small green light will flash 3-4 times .  This will be your only indicator the monitor has been turned on.     Do not shower for the first 24 hours.  You may shower after the first 24 hours.   Press button if you feel a symptom. You will hear a small click.  Record Date, Time and Symptom in the Patient Log Book.   When you are ready to remove patch, follow  instructions on last 2 pages of Patient Log Book.  Stick patch monitor onto last page of Patient Log Book.   Place Patient Log Book in [place] box.  Use locking tab on box and tape box closed securely.  The Orange and [employer] has [employer] on it.  Please place in mailbox as soon as possible.  Your physician should have your test results approximately 7 days after the monitor has been mailed back to [facility].   Call [facility] Customer Care at [phone] if you have questions regarding your ZIO XT patch monitor.  Call them immediately if you see an orange light blinking on your monitor.   If your monitor falls off in less than 4 days contact our Monitor department at [phone].  If your monitor becomes loose or falls off after 4 days call [employer] at [phone] for suggestions on securing your monitor.

## 2020-12-19 NOTE — Progress Notes (Signed)
Cardiology Office Note:    Date:  12/20/2020   ID:  Nancy Wallace, DOB Jul 27, 1995, MRN [id number]  PCP:  Maurice Small, MD   [facility]  Cardiologist:  Christell Constant, MD  Advanced Practice Provider:  No care team member to display Electrophysiologist:  None      CC: Palpitations follow up.  History of Present Illness:    Nancy Wallace is a 25 y.o. female with a hx of ITP, anxiety history of COVID-19 5/22 who presents for evaluation 09/13/20. In interim of this visit, patient had heart monitor ordered but not yet returned.  Patient notes that she is doing OK.  Since last visit notes no changes.  Relevant interval testing or therapy include mailing in device .  There are no interval hospital/ED visit.    Notes stabling chest pain at random times; not exertional.  No SOB/DOE and no PND/Orthopnea.   Significant improvement in palpitations.   Past Medical History:  Diagnosis Date  . Anxiety   . History of ITP   . Tachycardia   . Vitamin D deficiency     No past surgical history on file.  Current Medications: Current Meds  Medication Sig  . ADDERALL XR 20 MG 24 hr capsule Take 20 mg by mouth every morning.  Marland Kitchen amphetamine-dextroamphetamine (ADDERALL) 10 MG tablet Take 10 mg by mouth daily.   Marland Kitchen buPROPion (WELLBUTRIN XL) 150 MG 24 hr tablet Take 150 mg by mouth daily.  . Cholecalciferol (VITAMIN D3) 50 MCG (2000 UT) CAPS daily.     Allergies:   Patient has no known allergies.   Social History   Socioeconomic History  . Marital status: Significant Other    Spouse name: Not on file  . Number of children: Not on file  . Years of education: Not on file  . Highest education level: Not on file  Occupational History  . Not on file  Tobacco Use  . Smoking status: Never Smoker  . Smokeless tobacco: Never Used  Vaping Use  . Vaping Use: Never used  Substance and Sexual Activity  . Alcohol use: Yes  . Drug use: Not on file    Comment: CBD   . Sexual activity: Not on file  Other Topics Concern  . Not on file  Social History Narrative  . 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     All other systems reviewed and are negative.  EKGs/Labs/Other Studies Reviewed:    The following studies were reviewed today:  EKG:   06/27/20:  Sinus Tachy 113  Transthoracic Echocardiogram: Date: 07/07/20 Results: 1. Left ventricular ejection fraction, by estimation, is 65 to 70%. The  left ventricle has normal function. The left ventricle has no regional  wall motion abnormalities. Left ventricular diastolic parameters were  normal.  2. Right ventricular systolic function is normal. The right ventricular  size is normal. There is normal pulmonary artery systolic pressure.  3. The mitral valve is normal in structure. Trivial mitral valve  regurgitation.  4. The aortic valve is tricuspid. Aortic valve regurgitation is not  visualized.  5. The inferior vena cava is normal in size with greater than 50%  respiratory variability, suggesting right atrial pressure of 3 mmHg.   Recent Labs: 06/26/2020: BUN 12; Creatinine, Ser 0.69; Potassium 3.6; Sodium 137; TSH 2.586 06/30/2020: Hemoglobin 13.1; Platelet Count 106  Recent Lipid Panel No results found for: CHOL, TRIG, HDL, CHOLHDL, VLDL, LDLCALC, LDLDIRECT   Risk Assessment/Calculations:     N/A  Physical Exam:    VS:  BP 108/80   Pulse 90   Ht 5\' 6"  (1.676 m)   Wt 52.3 kg   SpO2 98%   BMI 18.59 kg/m      Wt Readings from Last 3 Encounters:   12/20/20 52.3 kg  09/13/20 48.9 kg  06/30/20 49.9 kg     GEN:  Well nourished, well developed in no acute distress HEENT: Normal NECK: No JVD  LYMPHATICS: No lymphadenopathy CARDIAC: RRR, no murmurs, rubs, gallops RESPIRATORY:  Clear to auscultation without rales, wheezing or rhonchi  ABDOMEN: Soft, non-tender, non-distended MUSCULOSKELETAL:  No edema; No deformity  SKIN: Warm and dry NEUROLOGIC:  Alert and oriented x 3 PSYCHIATRIC:  Normal affect   ASSESSMENT:    1. Palpitations    PLAN:    In order of problems listed above:  Palpitations- resolved without heart monitor Near Syncope- resolved - Heart Monitor had been lost by [facility]- we will work to either find the results or remove the charges as her symptoms has now resolved  PRN follow up unless new symptoms or abnormal test results warranting change in plan        Medication Adjustments/Labs and Tests Ordered: Current medicines are reviewed at length with the patient today.  Concerns regarding medicines are outlined above.  No orders of the defined types were placed in this encounter.  No orders of the defined types were placed in this encounter.   Patient Instructions  Medication Instructions:  Your physician recommends that you continue on your current medications as directed. Please refer to the Current Medication list given to you today.  *If you need a refill on your cardiac medications before your next appointment, please call your pharmacy*   Lab Work: NONE If you have labs (blood work) drawn today and your tests are completely normal, you will receive your results only by: [facility] MyChart Message (if you have MyChart) OR . A paper copy in the mail If you have any lab test that is abnormal or we need to change your treatment, we will call you to review the results.   Testing/Procedures: NONE   Follow-Up: As needed  At [facility], you and your health needs are our priority.  As part of our  continuing mission to provide you with exceptional heart care, we have created designated Provider Care Teams.  These Care Teams include your primary Cardiologist (physician) and Advanced Practice Providers (APPs -  Physician Assistants and Nurse Practitioners) who all work together to provide you with the care you need, when you need it.  We recommend signing up for the patient portal called "MyChart".  Sign up information is provided on this After Visit Summary.  MyChart is used to connect with patients for Virtual Visits (Telemedicine).  Patients are able to view lab/test results, encounter notes, upcoming appointments, etc.  Non-urgent messages can be sent to your provider as well.   To learn more about what you can do with MyChart, go to [facility].  Signed, Christell Constant, MD  12/20/2020 9:36 AM    [facility]

## 2020-12-20 ENCOUNTER — Ambulatory Visit (INDEPENDENT_AMBULATORY_CARE_PROVIDER_SITE_OTHER): Payer: BC Managed Care – PPO | Admitting: Internal Medicine

## 2020-12-20 ENCOUNTER — Other Ambulatory Visit: Payer: Self-pay

## 2020-12-20 ENCOUNTER — Encounter: Payer: Self-pay | Admitting: Internal Medicine

## 2020-12-20 VITALS — BP 108/80 | HR 90 | Ht 66.0 in | Wt 115.2 lb

## 2020-12-20 DIAGNOSIS — R002 Palpitations: Secondary | ICD-10-CM | POA: Diagnosis not present

## 2020-12-20 NOTE — Patient Instructions (Signed)
Medication Instructions:  Your physician recommends that you continue on your current medications as directed. Please refer to the Current Medication list given to you today.  *If you need a refill on your cardiac medications before your next appointment, please call your pharmacy*   Lab Work: NONE If you have labs (blood work) drawn today and your tests are completely normal, you will receive your results only by: Marland Kitchen MyChart Message (if you have MyChart) OR . A paper copy in the mail If you have any lab test that is abnormal or we need to change your treatment, we will call you to review the results.   Testing/Procedures: NONE   Follow-Up: As needed  At [facility], you and your health needs are our priority.  As part of our continuing mission to provide you with exceptional heart care, we have created designated Provider Care Teams.  These Care Teams include your primary Cardiologist (physician) and Advanced Practice Providers (APPs -  Physician Assistants and Nurse Practitioners) who all work together to provide you with the care you need, when you need it.  We recommend signing up for the patient portal called "MyChart".  Sign up information is provided on this After Visit Summary.  MyChart is used to connect with patients for Virtual Visits (Telemedicine).  Patients are able to view lab/test results, encounter notes, upcoming appointments, etc.  Non-urgent messages can be sent to your provider as well.   To learn more about what you can do with MyChart, go to [email or web address].

## 2020-12-21 DIAGNOSIS — F909 Attention-deficit hyperactivity disorder, unspecified type: Secondary | ICD-10-CM | POA: Diagnosis not present

## 2020-12-21 DIAGNOSIS — F39 Unspecified mood [affective] disorder: Secondary | ICD-10-CM | POA: Diagnosis not present

## 2020-12-28 ENCOUNTER — Telehealth: Payer: Self-pay

## 2020-12-28 NOTE — Telephone Encounter (Signed)
Called pt and left a vm with a new appt as dr e is out of the office 07/02/21   Nancy Wallace

## 2021-02-22 DIAGNOSIS — B359 Dermatophytosis, unspecified: Secondary | ICD-10-CM | POA: Diagnosis not present

## 2021-03-06 DIAGNOSIS — R102 Pelvic and perineal pain: Secondary | ICD-10-CM | POA: Diagnosis not present

## 2021-03-06 DIAGNOSIS — L309 Dermatitis, unspecified: Secondary | ICD-10-CM | POA: Diagnosis not present

## 2021-03-06 DIAGNOSIS — R350 Frequency of micturition: Secondary | ICD-10-CM | POA: Diagnosis not present

## 2021-05-29 DIAGNOSIS — R202 Paresthesia of skin: Secondary | ICD-10-CM | POA: Diagnosis not present

## 2021-05-29 DIAGNOSIS — K120 Recurrent oral aphthae: Secondary | ICD-10-CM | POA: Diagnosis not present

## 2021-05-29 DIAGNOSIS — F419 Anxiety disorder, unspecified: Secondary | ICD-10-CM | POA: Diagnosis not present

## 2021-06-29 ENCOUNTER — Inpatient Hospital Stay: Payer: BC Managed Care – PPO | Attending: Hematology & Oncology

## 2021-06-29 ENCOUNTER — Inpatient Hospital Stay (HOSPITAL_BASED_OUTPATIENT_CLINIC_OR_DEPARTMENT_OTHER): Payer: BC Managed Care – PPO | Admitting: Hematology & Oncology

## 2021-06-29 ENCOUNTER — Encounter: Payer: Self-pay | Admitting: Hematology & Oncology

## 2021-06-29 ENCOUNTER — Other Ambulatory Visit: Payer: Self-pay

## 2021-06-29 VITALS — BP 127/70 | HR 115 | Temp 98.0°F | Resp 17 | Wt 112.1 lb

## 2021-06-29 DIAGNOSIS — Z8616 Personal history of COVID-19: Secondary | ICD-10-CM | POA: Insufficient documentation

## 2021-06-29 DIAGNOSIS — R110 Nausea: Secondary | ICD-10-CM | POA: Diagnosis not present

## 2021-06-29 DIAGNOSIS — D696 Thrombocytopenia, unspecified: Secondary | ICD-10-CM | POA: Diagnosis not present

## 2021-06-29 DIAGNOSIS — D693 Immune thrombocytopenic purpura: Secondary | ICD-10-CM

## 2021-06-29 DIAGNOSIS — Z79899 Other long term (current) drug therapy: Secondary | ICD-10-CM | POA: Diagnosis not present

## 2021-06-29 DIAGNOSIS — R200 Anesthesia of skin: Secondary | ICD-10-CM | POA: Insufficient documentation

## 2021-06-29 LAB — CMP (CANCER CENTER ONLY)
ALT: 9 U/L (ref 0–44)
AST: 14 U/L — ABNORMAL LOW (ref 15–41)
Albumin: 4.5 g/dL (ref 3.5–5.0)
Alkaline Phosphatase: 43 U/L (ref 38–126)
Anion gap: 8 (ref 5–15)
BUN: 15 mg/dL (ref 6–20)
CO2: 26 mmol/L (ref 22–32)
Calcium: 10 mg/dL (ref 8.9–10.3)
Chloride: 106 mmol/L (ref 98–111)
Creatinine: 0.62 mg/dL (ref 0.44–1.00)
GFR, Estimated: >60 mL/min (ref >60)
Glucose, Bld: 94 mg/dL (ref 70–99)
Potassium: 4.6 mmol/L (ref 3.5–5.1)
Sodium: 140 mmol/L (ref 135–145)
Total Bilirubin: 1 mg/dL (ref 0.3–1.2)
Total Protein: 6.8 g/dL (ref 6.5–8.1)

## 2021-06-29 LAB — CBC WITH DIFFERENTIAL (CANCER CENTER ONLY)
Abs Immature Granulocytes: 0.01 10*3/uL (ref 0.00–0.07)
Basophils Absolute: 0 10*3/uL (ref 0.0–0.1)
Basophils Relative: 0 %
Eosinophils Absolute: 0 10*3/uL (ref 0.0–0.5)
Eosinophils Relative: 0 %
HCT: 41.9 % (ref 36.0–46.0)
Hemoglobin: 14.2 g/dL (ref 12.0–15.0)
Immature Granulocytes: 0 %
Lymphocytes Relative: 19 %
Lymphs Abs: 1.1 10*3/uL (ref 0.7–4.0)
MCH: 31.8 pg (ref 26.0–34.0)
MCHC: 33.9 g/dL (ref 30.0–36.0)
MCV: 93.7 fL (ref 80.0–100.0)
Monocytes Absolute: 0.3 10*3/uL (ref 0.1–1.0)
Monocytes Relative: 6 %
Neutro Abs: 4.1 10*3/uL (ref 1.7–7.7)
Neutrophils Relative %: 75 %
Platelet Count: 106 10*3/uL — ABNORMAL LOW (ref 150–400)
RBC: 4.47 MIL/uL (ref 3.87–5.11)
RDW: 11.9 % (ref 11.5–15.5)
WBC Count: 5.6 10*3/uL (ref 4.0–10.5)
nRBC: 0 % (ref 0.0–0.2)

## 2021-06-29 LAB — SAVE SMEAR(SSMR), FOR PROVIDER SLIDE REVIEW

## 2021-06-29 NOTE — Progress Notes (Signed)
Hematology and Oncology Follow Up Visit  Nancy Wallace [id number] [birth year] 25 y.o. 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  She is finished up college.  I am happy for her.  I am sure she will have no problems finding a job.  Her dad is living down in [place], [place].  He is doing pretty well down there.  She has only complained of some numbness on the left jaw.  This may have happened after she had a crown put in.  I did not see anything inside her oral cavity.  I cannot palpate any adenopathy.  She is not complaining of any tinnitus or ear pain.  She has had no problems with bleeding.  Her monthly cycles are still relatively normal.  She has had no issues with cough or shortness of breath.  I think she had her second bout of COVID this summer.  Seem to be quite mild.  There is been no problems with leg swelling.  She has had no petechia.  She has had no ecchymoses.  Overall, I would say performance status is ECOG 0.      Medications:  Current Outpatient Medications:    ADDERALL XR 20 MG 24 hr capsule, Take 20 mg by mouth every morning., Disp: , Rfl:    amphetamine-dextroamphetamine (ADDERALL) 10 MG tablet, Take 10 mg by mouth daily. , Disp: , Rfl: 0   buPROPion (WELLBUTRIN XL) 150 MG 24 hr tablet, Take 150 mg by mouth daily. (Patient not taking: Reported on 06/29/2021), Disp: , Rfl:    Cholecalciferol (VITAMIN D3) 50 MCG (2000 UT) CAPS, daily. (Patient not taking: Reported on 06/29/2021), Disp: , Rfl:   Allergies: No Known Allergies  Past Medical History, Surgical history, Social history, and Family History were reviewed and updated.  Review of Systems: Review of Systems  Constitutional: Negative.   HENT: Negative.    Eyes: Negative.   Respiratory: Negative.    Cardiovascular: Negative.   Gastrointestinal:  Positive for  nausea.  Genitourinary: Negative.   Musculoskeletal: Negative.   Skin: Negative.   Neurological: Negative.   Endo/Heme/Allergies: Negative.   Psychiatric/Behavioral: Negative.      Physical Exam:  weight is 112 lb 1.9 oz (50.9 kg). Her oral temperature is 98 F (36.7 C). Her blood pressure is 127/70 and her pulse is 115 (abnormal). Her respiration is 17 and oxygen saturation is 100%.   Wt Readings from Last 3 Encounters:  06/29/21 112 lb 1.9 oz (50.9 kg)  12/20/20 115 lb 3.2 oz (52.3 kg)  09/13/20 107 lb 12.8 oz (48.9 kg)     Physical Exam Vitals reviewed.  HENT:     Head: Normocephalic and atraumatic.  Eyes:     Pupils: Pupils are equal, round, and reactive to light.  Cardiovascular:     Rate and Rhythm: Normal rate and regular rhythm.     Heart sounds: Normal heart sounds.  Pulmonary:     Effort: Pulmonary effort is normal.     Breath sounds: Normal breath sounds.  Abdominal:     General: Bowel sounds are normal.     Palpations: Abdomen is soft.  Musculoskeletal:        General: No tenderness or deformity. Normal range of motion.     Cervical back: Normal range of motion.  Lymphadenopathy:     Cervical: No cervical adenopathy.  Skin:  General: Skin is warm and dry.     Findings: No erythema or rash.  Neurological:     Mental Status: She is alert and oriented to person, place, and time.  Psychiatric:        Behavior: Behavior normal.        Thought Content: Thought content normal.        Judgment: Judgment normal.     Lab Results  Component Value Date   WBC 5.6 06/29/2021   HGB 14.2 06/29/2021   HCT 41.9 06/29/2021   MCV 93.7 06/29/2021   PLT 106 (L) 06/29/2021     Chemistry      Component Value Date/Time   NA 140 06/29/2021 1445   K 4.6 06/29/2021 1445   CL 106 06/29/2021 1445   CO2 26 06/29/2021 1445   BUN 15 06/29/2021 1445   CREATININE 0.62 06/29/2021 1445      Component Value Date/Time   CALCIUM 10.0 06/29/2021 1445   ALKPHOS 43  06/29/2021 1445   AST 14 (L) 06/29/2021 1445   ALT 9 06/29/2021 1445   BILITOT 1.0 06/29/2021 1445      Impression and Plan: Nancy Wallace is a 25 year old white female. She has mild thrombocytopenia.  Her platelet count is holding steady.  She has mild thrombocytopenia.  I will think that she would always be asymptomatic with this.    I looked at her blood under the microscope.  I really was not too impressed.  Her platelets looked large.  Platelets were well granulated.  I will still plan to get her back yearly.  I think this is very reasonable.    Josph Macho, MD 12/15/20223:43 PM

## 2021-06-30 ENCOUNTER — Other Ambulatory Visit: Payer: BC Managed Care – PPO

## 2021-06-30 ENCOUNTER — Ambulatory Visit: Payer: BC Managed Care – PPO | Admitting: Hematology & Oncology

## 2021-06-30 ENCOUNTER — Telehealth: Payer: Self-pay | Admitting: Hematology & Oncology

## 2021-06-30 NOTE — Telephone Encounter (Signed)
Scheduled appt per 06/29/21 los- mailed letter with appt date and time for 1 yr f/u

## 2021-09-28 ENCOUNTER — Ambulatory Visit (INDEPENDENT_AMBULATORY_CARE_PROVIDER_SITE_OTHER): Payer: BC Managed Care – PPO | Admitting: Family Medicine

## 2021-09-28 ENCOUNTER — Encounter: Payer: Self-pay | Admitting: Family Medicine

## 2021-09-28 VITALS — BP 133/82 | HR 93 | Ht 66.0 in | Wt 114.8 lb

## 2021-09-28 DIAGNOSIS — R531 Weakness: Secondary | ICD-10-CM

## 2021-09-28 DIAGNOSIS — Z8639 Personal history of other endocrine, nutritional and metabolic disease: Secondary | ICD-10-CM

## 2021-09-28 DIAGNOSIS — R5383 Other fatigue: Secondary | ICD-10-CM | POA: Diagnosis not present

## 2021-09-28 DIAGNOSIS — H5350 Unspecified color vision deficiencies: Secondary | ICD-10-CM | POA: Diagnosis not present

## 2021-09-28 DIAGNOSIS — E559 Vitamin D deficiency, unspecified: Secondary | ICD-10-CM | POA: Diagnosis not present

## 2021-09-28 LAB — COMPREHENSIVE METABOLIC PANEL
ALT: 12 U/L (ref 0–35)
AST: 14 U/L (ref 0–37)
Albumin: 4.5 g/dL (ref 3.5–5.2)
Alkaline Phosphatase: 41 U/L (ref 39–117)
BUN: 15 mg/dL (ref 6–23)
CO2: 28 mEq/L (ref 19–32)
Calcium: 9.1 mg/dL (ref 8.4–10.5)
Chloride: 103 mEq/L (ref 96–112)
Creatinine, Ser: 0.68 mg/dL (ref 0.40–1.20)
GFR: 119.25 mL/min (ref >60.00)
Glucose, Bld: 78 mg/dL (ref 70–99)
Potassium: 3.9 mEq/L (ref 3.5–5.1)
Sodium: 137 mEq/L (ref 135–145)
Total Bilirubin: 1.5 mg/dL — ABNORMAL HIGH (ref 0.2–1.2)
Total Protein: 6.5 g/dL (ref 6.0–8.3)

## 2021-09-28 LAB — CBC
HCT: 40.9 % (ref 36.0–46.0)
Hemoglobin: 13.8 g/dL (ref 12.0–15.0)
MCHC: 33.7 g/dL (ref 30.0–36.0)
MCV: 92.8 fl (ref 78.0–100.0)
Platelets: 92 10*3/uL — ABNORMAL LOW (ref 150.0–400.0)
RBC: 4.41 Mil/uL (ref 3.87–5.11)
RDW: 12.7 % (ref 11.5–15.5)
WBC: 3 10*3/uL — ABNORMAL LOW (ref 4.0–10.5)

## 2021-09-28 LAB — VITAMIN B12: Vitamin B-12: 489 pg/mL (ref 211–911)

## 2021-09-28 LAB — TSH: TSH: 1.74 u[IU]/mL (ref 0.35–5.50)

## 2021-09-28 LAB — VITAMIN D 25 HYDROXY (VIT D DEFICIENCY, FRACTURES): VITD: 13.64 ng/mL — ABNORMAL LOW (ref 30.00–100.00)

## 2021-09-28 NOTE — Progress Notes (Signed)
? ?______________________________________________________________________ ? ?HPI ?Nancy Wallace is a 26 y.o. female presenting to [facility] today to establish care.  ? ?Adopted. Biological aunt with MS, unsure of age of diagnosis, died in 35s. ? ?Patient Care Team: ?Clayborne Dana, NP as PCP - General (Family Medicine) ?Christell Constant, MD as PCP - Cardiology (Cardiology) ? ?Health Maintenance  ?Topic Date Due  ? HIV Screening  Never done  ? Hepatitis C Screening: USPSTF Recommendation to screen - Ages 25-79 yo.  Never done  ? Pap Smear  09/12/2019  ? Pap Smear  09/12/2019  ? COVID-19 Vaccine (3 - Booster for Moderna series) 12/29/2019  ? Flu Shot  10/13/2021*  ? Tetanus Vaccine  09/29/2022*  ? HPV Vaccine  Completed  ?*Topic was postponed. The date shown is not the original due date.  ? ? ? ?Concerns today: ?ITP - stable; follows with hematology yearly ?ADHD - stopped taking Adderall because of side effects a few months ago (although she had tolerated well previously for many years)- insomnia, tachycardia, anxiety, etc - all resolved after stopping med ?Strange changes over the past few years - visual color changes (eye doctor recommended neuro referral for possible MS workup), intermittent balance issues, trouble articulating what she is trying to say at times, trouble keeping balance when going upstairs intermittently, occasional tingling on the left side of cheek wraps around to posterior neck (root canal 2 years ago, but persisting), sleepy all the time, new weakness (last fall she could do at least 5 pushups in a row, but now she can't even do one pushup)  ?FATIGUE - sleeps at least 8 hours  ?Duration:  months ?Severity: severe  ?Onset: gradual ?Context when symptoms started:  unknown ?Symptoms improve with rest: no  ?Depressive symptoms: no ?Stress/anxiety: no ?Insomnia: no  ?Snoring: no ?Observed apnea by bed partner: no ?Daytime hypersomnolence:yes ?Wakes feeling  refreshed: no ?History of sleep study: no ?Dysnea on exertion:  no ?Orthopnea/PND: no ?Chest pain: no ?Chronic cough: no ?Lower extremity edema: no ?Arthralgias:no ?Myalgias: no ?Weakness: yeah ?Rash: no ?Weight loss: no   ?Hemoptysis: no  ?New medications: no  ?Leg swelling: no  ?PND: no  ?Melena: no  ?Adenopathy: no  ?Skin changes: no  ?Feeling depressed: no  ?Anhedonia: no  ?Altered appetite: no  ?Thyroid dysfxn: no ? ? ? ?Patient Active Problem List  ? Diagnosis Date Noted  ? Palpitations 12/20/2020  ? 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 ?Flowsheet Row Office Visit from 09/28/2021 in [facility]  ?PHQ-2 Total Score 0  ? ?  ? ? ? ?______________________________________________________________________ ?PMH ?Past Medical History:  ?Diagnosis Date  ? ADHD   ? Anxiety   ? Clotting disorder (HCC)   ? Depression   ? History of ITP   ? Tachycardia   ? Vitamin D deficiency   ? ? ?ROS ?All review of systems negative except what is listed in the HPI ? ?PHYSICAL EXAM ?Physical Exam ?Vitals reviewed.  ?Constitutional:   ?   Appearance: Normal appearance.  ?HENT:  ?   Head: Normocephalic and atraumatic.  ?Eyes:  ?   Extraocular Movements: Extraocular movements intact.  ?   Conjunctiva/sclera: Conjunctivae normal.  ?   Pupils: Pupils are equal, round, and reactive to light.  ?Cardiovascular:  ?   Rate and Rhythm: Normal rate and regular rhythm.  ?Pulmonary:  ?   Effort: Pulmonary effort is normal.  ?  Breath sounds: Normal breath sounds.  ?Musculoskeletal:  ?   Cervical back: Normal range of motion and neck supple.  ?Skin: ?   General: Skin is warm and dry.  ?   Capillary Refill: Capillary refill takes less than 2 seconds.  ?Neurological:  ?   General: No focal deficit present.  ?   Mental Status: She is alert and oriented to person, place, and time. Mental status is at baseline.  ?   Cranial Nerves: No cranial nerve deficit.  ?   Motor: No weakness.  ?    Coordination: Coordination normal.  ?   Gait: Gait normal.  ?Psychiatric:     ?   Mood and Affect: Mood normal.     ?   Behavior: Behavior normal.     ?   Thought Content: Thought content normal.     ?   Judgment: Judgment normal.  ? ?______________________________________________________________________ ?ASSESSMENT AND PLAN ? ?1. Fatigue, unspecified type ?Labs today. Will update with results.  ?- Comprehensive metabolic panel ?- CBC ?- TSH ?- Vitamin B12 ?- Ambulatory referral to Neurology ? ?2. Vitamin D deficiency ?Updating labs today.  ?- Vitamin D (25 hydroxy) ? ?3. History of non anemic vitamin B12 deficiency ?Updating labs today ?- Vitamin B12 ? ?4. Visual color changes ?5. Attacks of weakness ?Basic labs today, but referring to Neuro based on ophthalmologist recommendation, intermittent symptoms described above and family history.  ?- Ambulatory referral to Neurology ? ?Establish care ?Education provided today during visit and on AVS for patient to review at home.  ?Diet and Exercise recommendations provided.  ?Current diagnoses and recommendations discussed. ?HM recommendations reviewed with recommendations.  ? ? ?Outpatient Encounter Medications as of 09/28/2021  ?Medication Sig  ? ADDERALL XR 20 MG 24 hr capsule Take 20 mg by mouth every morning. (Patient not taking: Reported on 09/28/2021)  ? amphetamine-dextroamphetamine (ADDERALL) 10 MG tablet Take 10 mg by mouth daily.  (Patient not taking: Reported on 09/28/2021)  ? Cholecalciferol (VITAMIN D3) 50 MCG (2000 UT) CAPS daily. (Patient not taking: Reported on 06/29/2021)  ? [DISCONTINUED] buPROPion (WELLBUTRIN XL) 150 MG 24 hr tablet Take 150 mg by mouth daily. (Patient not taking: Reported on 06/29/2021)  ? ?No facility-administered encounter medications on file as of 09/28/2021.  ? ? ?Return if symptoms worsen or fail to improve. ? ?Patient aware of signs/symptoms requiring further/urgent evaluation.  ? ? ?Lollie Marrow Reola Calkins, DNP, FNP-C ? ? ?

## 2021-09-28 NOTE — Patient Instructions (Signed)
Thank you for choosing [facility] for your Primary Care needs. I am excited for the opportunity to partner with you to meet your health care goals. It was a pleasure meeting you today! ? ?Recommendations from today's visit: ?Schedule a follow-up after you see Neuro, maybe 4-6 months from now to touch base again ? ?Information on diet, exercise, and health maintenance recommendations are listed below. This is information to help you be sure you are on track for optimal health and monitoring.  ? ?Please look over this and let us know if you have any questions or if you have completed any of the health maintenance outside of [facility] so that we can be sure your records are up to date.  ?___________________________________________________________ ? ?MyChart:  ?For all urgent or time sensitive needs we ask that you please call the office to avoid delays. Our number is [phone]. ?MyChart is not constantly monitored and due to the large volume of messages a day, replies may take up to 72 business hours. ? ?MyChart Policy: ?MyChart allows for you to see your visit notes, after visit summary, provider recommendations, lab and tests results, make an appointment, request refills, and contact your provider or the office for non-urgent questions or concerns. Providers are seeing patients during normal business hours and do not have built in time to review MyChart messages.  ?We ask that you allow a minimum of 3 business days for responses to [facility]. For this reason, please do not send urgent requests through MyChart. Please call the office at [phone]. ?New and ongoing conditions may require a visit. We have virtual and in-person visits available for your convenience.  ?Complex MyChart concerns may require a visit. Your provider may request you schedule a virtual or in-person visit to ensure we are providing the best care possible. ?MyChart messages sent after 11:00 AM on  Friday will not be received by the provider until Monday morning.  ?  ?Lab and Test Results: ?You will receive your lab and test results on MyChart as soon as they are completed and results have been sent by the lab or testing facility. Due to this service, you will receive your results BEFORE your provider.  ?I review lab and test results each morning prior to seeing patients. Some results require collaboration with other providers to ensure you are receiving the most appropriate care. For this reason, we ask that you please allow a minimum of 3-5 business days from the time that ALL results have been received for your provider to receive and review lab and test results and contact you about these.  ?Most lab and test result comments from the provider will be sent through MyChart. Your provider may recommend changes to the plan of care, follow-up visits, repeat testing, ask questions, or request an office visit to discuss these results. You may reply directly to this message or call the office to provide information for the provider or set up an appointment. ?In some instances, you will be called with test results and recommendations. Please let us know if this is preferred and we will make note of this in your chart to provide this for you.    ?If you have not heard a response to your lab or test results in 5 business days from all results returning to MyChart, please call the office to let us know. We ask that you please avoid calling prior to this time unless there is an emergent concern.  Due to high call volumes, this can delay the resulting process. ? ?After Hours: ?For all non-emergency after hours needs, please call the office at [phone] and select the option to reach the on-call  service. On-call services are shared between multiple [facility] offices and therefore it will not be possible to speak directly with your provider. On-call providers may provide medical advice and recommendations, but are  unable to provide refills for maintenance medications.  ?For all emergency or urgent medical needs after normal business hours, we recommend that you seek care at the closest Urgent Care or Emergency Department to ensure appropriate treatment in a timely manner.  ?[facility] has a 24 hour emergency room located on the ground floor for your convenience.  ? ?Urgent Concerns During the Business Day ?Providers are seeing patients from 8AM to [place] with a busy schedule and are most often not able to respond to non-urgent calls until the end of the day or the next business day. ?If you should have URGENT concerns during the day, please call and speak to the nurse or schedule a same day appointment so that we can address your concern without delay.  ? ?Thank you, again, for choosing me as your health care partner. I appreciate your trust and look forward to learning more about you.  ? ?Purcell Nails. Olevia Bowens, DNP, FNP-C ? ?___________________________________________________________ ? ?Health Maintenance Recommendations ?Screening Testing ?Mammogram ?Every 1-2 years based on history and risk factors ?Starting at age 34 ?Pap Smear ?Ages 21-39 every 3 years ?Ages 51-65 every 5 years with HPV testing ?More frequent testing may be required based on results and history ?Colon Cancer Screening ?Every 1-10 years based on test performed, risk factors, and history ?Starting at age 25 ?Bone Density Screening ?Every 2-10 years based on history ?Starting at age 69 for women ?Recommendations for men differ based on medication usage, history, and risk factors ?AAA Screening ?One time ultrasound ?Men 69-45 years old who have ever smoked ?Lung Cancer Screening ?Low Dose Lung CT every 12 months ?Age 40-80 years with a 20 pack-year smoking history who still smoke or who have quit within the last 15 years ? ?Screening Labs ?Routine  Labs: Complete Blood Count (CBC), Complete Metabolic Panel (CMP), Cholesterol (Lipid  Panel) ?Every 6-12 months based on history and medications ?May be recommended more frequently based on current conditions or previous results ?Hemoglobin A1c Lab ?Every 3-12 months based on history and previous results ?Starting at age 53 or earlier with diagnosis of diabetes, high cholesterol, BMI >26, and/or risk factors ?Frequent monitoring for patients with diabetes to ensure blood sugar control ?Thyroid Panel (TSH w/ T3 & T4) ?Every 6 months based on history, symptoms, and risk factors ?May be repeated more often if on medication ?HIV ?One time testing for all patients 92 and older ?May be repeated more frequently for patients with increased risk factors or exposure ?Hepatitis C ?One time testing for all patients 62 and older ?May be repeated more frequently for patients with increased risk factors or exposure ?Gonorrhea, Chlamydia ?Every 12 months for all sexually active persons 13-24 years ?Additional monitoring may be recommended for those who are considered high risk or who have symptoms ?PSA ?Men 37-6 years old with risk factors ?Additional screening may be recommended from age 37-69 based on risk factors, symptoms, and history ? ?Vaccine Recommendations ?Tetanus Booster ?All adults every 10 years ?Flu Vaccine ?All patients 6 months and older every year ?COVID Vaccine ?All patients 12 years and older ?  Initial dosing with booster ?May recommend additional booster based on age and health history ?HPV Vaccine ?2 doses all patients age 40-26 ?Dosing may be considered for patients over 26 ?Shingles Vaccine (Shingrix) ?2 doses all adults 50 years and older ?Pneumonia (Pneumovax 23) ?All adults 65 years and older ?May recommend earlier dosing based on health history ?Pneumonia (Prevnar 13) ?All adults 65 years and older ?Dosed 1 year after Pneumovax 23 ?Pneumonia (Prevnar 20) ?All adults 65 years and older (adults 19-64 with certain conditions or risk factors) ?1 dose  ?For those who have no received Prevnar 13  vaccine previously ? ? ?Additional Screening, Testing, and Vaccinations may be recommended on an individualized basis based on family history, health history, risk factors, and/or exposure.  ?______________________

## 2021-09-29 ENCOUNTER — Encounter: Payer: Self-pay | Admitting: Family Medicine

## 2021-09-29 DIAGNOSIS — R17 Unspecified jaundice: Secondary | ICD-10-CM

## 2021-10-17 ENCOUNTER — Ambulatory Visit: Payer: BC Managed Care – PPO | Admitting: Family

## 2021-10-18 ENCOUNTER — Telehealth: Payer: Self-pay

## 2021-10-18 ENCOUNTER — Emergency Department (HOSPITAL_COMMUNITY): Payer: BC Managed Care – PPO

## 2021-10-18 ENCOUNTER — Emergency Department (HOSPITAL_COMMUNITY)
Admission: EM | Admit: 2021-10-18 | Discharge: 2021-10-18 | Disposition: A | Discharge status: Home / Self Care | Payer: BC Managed Care – PPO | Attending: Emergency Medicine | Admitting: Emergency Medicine

## 2021-10-18 ENCOUNTER — Encounter (HOSPITAL_COMMUNITY): Payer: Self-pay

## 2021-10-18 ENCOUNTER — Other Ambulatory Visit: Payer: Self-pay

## 2021-10-18 DIAGNOSIS — R202 Paresthesia of skin: Secondary | ICD-10-CM | POA: Diagnosis not present

## 2021-10-18 DIAGNOSIS — Z5321 Procedure and treatment not carried out due to patient leaving prior to being seen by health care provider: Secondary | ICD-10-CM | POA: Diagnosis not present

## 2021-10-18 DIAGNOSIS — R000 Tachycardia, unspecified: Secondary | ICD-10-CM | POA: Diagnosis not present

## 2021-10-18 DIAGNOSIS — R200 Anesthesia of skin: Secondary | ICD-10-CM | POA: Insufficient documentation

## 2021-10-18 DIAGNOSIS — R519 Headache, unspecified: Secondary | ICD-10-CM | POA: Diagnosis not present

## 2021-10-18 LAB — CBC WITH DIFFERENTIAL/PLATELET
Abs Immature Granulocytes: 0.01 10*3/uL (ref 0.00–0.07)
Basophils Absolute: 0 10*3/uL (ref 0.0–0.1)
Basophils Relative: 0 %
Eosinophils Absolute: 0 10*3/uL (ref 0.0–0.5)
Eosinophils Relative: 1 %
HCT: 42.2 % (ref 36.0–46.0)
Hemoglobin: 14 g/dL (ref 12.0–15.0)
Immature Granulocytes: 0 %
Lymphocytes Relative: 26 %
Lymphs Abs: 1.2 10*3/uL (ref 0.7–4.0)
MCH: 31 pg (ref 26.0–34.0)
MCHC: 33.2 g/dL (ref 30.0–36.0)
MCV: 93.4 fL (ref 80.0–100.0)
Monocytes Absolute: 0.3 10*3/uL (ref 0.1–1.0)
Monocytes Relative: 7 %
Neutro Abs: 3 10*3/uL (ref 1.7–7.7)
Neutrophils Relative %: 66 %
Platelets: 129 10*3/uL — ABNORMAL LOW (ref 150–400)
RBC: 4.52 MIL/uL (ref 3.87–5.11)
RDW: 11.9 % (ref 11.5–15.5)
WBC: 4.5 10*3/uL (ref 4.0–10.5)
nRBC: 0 % (ref 0.0–0.2)

## 2021-10-18 LAB — COMPREHENSIVE METABOLIC PANEL
ALT: 13 U/L (ref 0–44)
AST: 16 U/L (ref 15–41)
Albumin: 4.3 g/dL (ref 3.5–5.0)
Alkaline Phosphatase: 39 U/L (ref 38–126)
Anion gap: 6 (ref 5–15)
BUN: 11 mg/dL (ref 6–20)
CO2: 27 mmol/L (ref 22–32)
Calcium: 9.2 mg/dL (ref 8.9–10.3)
Chloride: 107 mmol/L (ref 98–111)
Creatinine, Ser: 0.76 mg/dL (ref 0.44–1.00)
GFR, Estimated: >60 mL/min (ref >60)
Glucose, Bld: 109 mg/dL — ABNORMAL HIGH (ref 70–99)
Potassium: 3.5 mmol/L (ref 3.5–5.1)
Sodium: 140 mmol/L (ref 135–145)
Total Bilirubin: 1.4 mg/dL — ABNORMAL HIGH (ref 0.3–1.2)
Total Protein: 6.7 g/dL (ref 6.5–8.1)

## 2021-10-18 LAB — I-STAT BETA HCG BLOOD, ED (MC, WL, AP ONLY): I-stat hCG, quantitative: <5 m[IU]/mL (ref <5)

## 2021-10-18 NOTE — ED Triage Notes (Signed)
Pt arrived POV from home c/o left sided tingling and numbness and a crooked smile. Pt's last known well was 7 am.  ?

## 2021-10-18 NOTE — Telephone Encounter (Signed)
Nurse Assessment ?Nurse: Elesa Hacker RN, Nash Dimmer Date/Time Lamount Cohen Time): 10/18/2021 3:16:22 PM ?Confirm and document reason for call. If ?symptomatic, describe symptoms. ?---Caller states new patient of Dr. Reola Calkins. Patient is ?having balance issues, now having tingling on the left ?side of her body. Upcoming neurology appt in May. ?Patient does see MD at [facility]. Caller ?states that numbness/tingling is a new symptom. Caller ?advised that her smile is crooked. ?Does the patient have any new or worsening ?symptoms? ---Yes ?Will a triage be completed? ---Yes ?Related visit to physician within the last 2 weeks? ---No ?Does the PT have any chronic conditions? (i.e. ?diabetes, asthma, this includes High risk factors for ?pregnancy, etc.) ?---Yes ?List chronic conditions. ---ITP ?Is the patient pregnant or possibly pregnant? (Ask ?all females between the ages of 21-55) ---No ?Is this a behavioral health or substance abuse call? ---No ?PLEASE NOTE: All timestamps contained within this report are represented as [place] Standard Time. ?CONFIDENTIALTY NOTICE: This fax transmission is intended only for the addressee. It contains information that is legally privileged, confidential or ?otherwise protected from use or disclosure. If you are not the intended recipient, you are strictly prohibited from reviewing, disclosing, copying using ?or disseminating any of this information or taking any action in reliance on or regarding this information. If you have received this fax in error, please ?notify us immediately by telephone so that we can arrange for its return to [place]. Phone: [phone], Toll-Free: [phone], Fax: [phone] ?Page: 2 of 2 ?Call Id: [id number] ?Guidelines ?Guideline Title Affirmed Question Affirmed Notes Nurse Date/Time (Eastern ?Time) ?Neurologic Deficit [1] Numbness (i.e., ?loss of sensation) of ?the face, arm / hand, ?or leg / foot on one ?side of the body AND ?[2] sudden onset ?AND [3] present  now ?Deaton, RN, Nash Dimmer 10/18/2021 3:18:05 PM ?Disp. Time (Eastern ?Time) Disposition Final User ?10/18/2021 3:14:44 PM Send to Urgent Nicholaus Bloom ?10/18/2021 3:20:44 PM 911 Outcome Documentation Deaton, RN, Nash Dimmer ?Reason: Caller refused, having ?boyfriend drive her to the ED by POV. ?10/18/2021 3:19:37 PM Call EMS 911 Now Yes Deaton, RN, Nash Dimmer ?Caller Disagree/Comply Disagree ?Caller Understands Yes ?PreDisposition Did not know what to do ?Care Advice Given Per Guideline ?CALL EMS 911 NOW: * Immediate medical attention is needed. You need to hang up and call 911 (or an ambulance). * Triager ?Discretion: I'll call you back in a few minutes to be sure you were able to reach them. CARE ADVICE given per Neurologic Deficit ?(Adult) guideline. ?Referrals ?[facility] - ED ?

## 2021-10-18 NOTE — Telephone Encounter (Signed)
Pt at ED.

## 2021-10-18 NOTE — ED Notes (Signed)
Patient states she is leaving. 

## 2021-10-18 NOTE — ED Provider Triage Note (Signed)
Emergency Medicine Provider Triage Evaluation Note ? ?Nancy Wallace , a 26 y.o. female  was evaluated in triage.  Pt complains of numbness to the left side of her body that has been ongoing since Sunday but then resolved.  Today, she reports around 7 AM noticing her left arm to be numb, left foot feels different.  Feel that her symptoms have exacerbated.  Normal hours 7 AM.  Was seen by PCP recently who wanted to referred her to neurology in order to have a full evaluation. ? ?Review of Systems  ?Positive: Numbness, tingling ?Negative: Chest pain, sob, headache, vision ? ?Physical Exam  ?BP (!) 144/111   Pulse (!) 124   Temp (!) 97.5 ?F (36.4 ?C) (Oral)   Resp 18   SpO2 98%  ?Gen:   Awake, no distress   ?Resp:  Normal effort  ?MSK:   Moves extremities without difficulty  ?Other:   ? ?Medical Decision Making  ?Medically screening exam initiated at 3:53 PM.  Appropriate orders placed.  Nancy Wallace was informed that the remainder of the evaluation will be completed by another provider, this initial triage assessment does not replace that evaluation, and the importance of remaining in the ED until their evaluation is complete. ? ?Full neuro exam is unremarkable.  Blood pressure is high in triage along with tachycardia. Prior hx of clotting disorder.  ?  Nancy Manges, PA-C ?10/18/21 1600 ? ?

## 2021-11-30 ENCOUNTER — Ambulatory Visit (INDEPENDENT_AMBULATORY_CARE_PROVIDER_SITE_OTHER): Payer: BC Managed Care – PPO | Admitting: Neurology

## 2021-11-30 ENCOUNTER — Encounter: Payer: Self-pay | Admitting: Neurology

## 2021-11-30 VITALS — BP 131/81 | HR 113 | Ht 64.0 in | Wt 120.0 lb

## 2021-11-30 DIAGNOSIS — R260 Ataxic gait: Secondary | ICD-10-CM

## 2021-11-30 DIAGNOSIS — H5310 Unspecified subjective visual disturbances: Secondary | ICD-10-CM | POA: Diagnosis not present

## 2021-11-30 DIAGNOSIS — R200 Anesthesia of skin: Secondary | ICD-10-CM

## 2021-11-30 NOTE — Progress Notes (Signed)
[facility]  PATIENT: Nancy Wallace DOB: 06-22-1996  REFERRING DOCTOR OR PCP: Hyman Hopes,  NP SOURCE: Patient, notes from primary care, imaging and lab reports, CT scan images personally reviewed.  _________________________________   HISTORICAL  CHIEF COMPLAINT:  Chief Complaint  Patient presents with   New Patient (Initial Visit)    Rm 1, alone. Pt referred for possible MS concerns. Pt c/o fatigue, visual color change, and  attacks of weakness. Pt has had tingling on L side of face for the last 1.5 years that has gradually spread to base of L neck, L arm, and L leg. Pt has noticed balance problems going up stairs.     HISTORY OF PRESENT ILLNESS:  I had the pleasure seeing your patient, Nancy Wallace, at the [facility] at [facility] for neurologic consultation regarding her numbness, visual changes and fatigue and concern about multiple sclerosis.  Ms. Tye is a 26 year old woman who noted the onset of left facial tingling starting late 2021.   Although somewhat improved, the numbness has not completely resolved.    In April 2023,  she had the onset of more numbness going into her neck and then arm and to a lesser extent into her left leg.   She went to the urgent care at [facility].  A head CT was performed and was normal.    She was discharged.   Summer 2022, she had a lot of OD pain that worsened with eye movements.   She did not note a visual disturbance at that time.   However, she did note altered vision with mild graying of her vision, worse out of right eye.   She saw ophthalmology and reduced color vision was noted OD.    Her ophthalmologist told her he was concerned about optic neuritis.      Gait is doing worse since November or December 2022.  She can now only walk 45 minutes without a break and is slower than last year.  Gait was unlimited previously.  She notes mild reduced balance issues going uphill or upstairs.     She started  to use the bannister on stairs late last year when she first noted the balance and gait issues.  She notes urinary urgency started sometime during the second half of 2022, possibly the same time as balance issues.  She is much more fatigued the last year.   She feels happy but also feels depressed mood at times.    She has situational anxiety and sees therapy.    She has ADHD diagnosed in third grade.   She stopped her stimulant due to palpitations.   She is in school Writer).   She finds writing thoughts more difficulty.   She currently sleeps well but had insomnia in the past when taking more classes.     She was diagnosed with ITP in 2017 (Dr. Myna Hidalgo).    Platelets are 90-120 usually and she has not required treatment.     She is adopted but knows some of her family history.   She has a maternal aunt with MS .   Her biologic sister died with a brain tumor as a toddler.     REVIEW OF SYSTEMS: Constitutional: No fevers, chills, sweats, or change in appetite.  She notes fatigue Eyes: No visual changes, double vision, eye pain Ear, nose and throat: No hearing loss, ear pain, nasal congestion, sore throat Cardiovascular: No chest pain, palpitations Respiratory:  No shortness of  breath at rest or with exertion.   No wheezes GastrointestinaI: No nausea, vomiting, diarrhea, abdominal pain, fecal incontinence Genitourinary:  No dysuria, urinary retention.  She has urinary urgency at times. Musculoskeletal:  No neck pain, back pain Integumentary: No rash, pruritus, skin lesions Neurological: as above Psychiatric: She has ADD.  She has had some depression and anxiety but not much of a problem recently Endocrine: No palpitations, diaphoresis, change in appetite, change in weigh or increased thirst Hematologic/Lymphatic:  No anemia, purpura, petechiae..  She has mild ITP Allergic/Immunologic: No itchy/runny eyes, nasal congestion, recent allergic reactions, rashes  ALLERGIES: No Known  Allergies  HOME MEDICATIONS:  Current Outpatient Medications:    ADDERALL XR 20 MG 24 hr capsule, Take 20 mg by mouth every morning. (Patient not taking: Reported on 09/28/2021), Disp: , Rfl:    amphetamine-dextroamphetamine (ADDERALL) 10 MG tablet, Take 10 mg by mouth daily.  (Patient not taking: Reported on 09/28/2021), Disp: , Rfl: 0   Cholecalciferol (VITAMIN D3) 50 MCG (2000 UT) CAPS, daily. (Patient not taking: Reported on 06/29/2021), Disp: , Rfl:   PAST MEDICAL HISTORY: Past Medical History:  Diagnosis Date   ADHD    Anxiety    Clotting disorder (HCC)    Depression    History of ITP    Tachycardia    Vitamin D deficiency     PAST SURGICAL HISTORY: History reviewed. No pertinent surgical history.  FAMILY HISTORY: Family History  Adopted: Yes  Problem Relation Age of Onset   Multiple sclerosis Other     SOCIAL HISTORY:  Social History   Socioeconomic History   Marital status: Single    Spouse name: Not on file   Number of children: Not on file   Years of education: Not on file   Highest education level: Some college, no degree  Occupational History   Not on file  Tobacco Use   Smoking status: Never   Smokeless tobacco: Never  Vaping Use   Vaping Use: Never used  Substance and Sexual Activity   Alcohol use: Yes   Drug use: Never    Comment: socially   Sexual activity: Yes  Other Topics Concern   Not on file  Social History Narrative   Lives alone   R handed   Caffeine:rare   Social Determinants of Health   Financial Resource Strain: Not on file  Food Insecurity: Not on file  Transportation Needs: Not on file  Physical Activity: Not on file  Stress: Not on file  Social Connections: Not on file  Intimate Partner Violence: Not on file     PHYSICAL EXAM  Vitals:   11/30/21 1024  BP: 131/81  Pulse: (!) 113  Weight: 120 lb (54.4 kg)  Height: 5\' 4"  (1.626 m)    Body mass index is 20.6 kg/m.  Visual acuity: OD 20/20-1; OS  20/20-2  General: The patient is well-developed and well-nourished and in no acute distress  HEENT:  Head is [place]/AT.  Sclera are anicteric.  Funduscopic exam shows normal optic discs and retinal vessels.  Neck: No carotid bruits are noted.  The neck is nontender.  Cardiovascular: The heart has a regular rate and rhythm with a normal S1 and S2. There were no murmurs, gallops or rubs.    Skin: Extremities are without rash or  edema.  Musculoskeletal:  Back is nontender  Neurologic Exam  Mental status: The patient is alert and oriented x 3 at the time of the examination. The patient has apparent normal recent  and remote memory, with an apparently normal attention span and concentration ability.   Speech is normal.  Cranial nerves: Extraocular movements are full.  She had a 1+ right APD.:  Vision is desaturated OD facial symmetry is present. There is good facial sensation to soft touch bilaterally.Facial strength is normal.  Trapezius and sternocleidomastoid strength is normal. No dysarthria is noted.  The tongue is midline, and the patient has symmetric elevation of the soft palate. No obvious hearing deficits are noted.  Motor:  Muscle bulk is normal.   Tone is normal. Strength is  5 / 5 in all 4 extremities.   Sensory: Sensory testing is intact to pinprick, soft touch and vibration sensation in the arms and mild asymmetry in the legs.  Coordination: Cerebellar testing reveals good finger-nose-finger and heel-to-shin bilaterally.  Gait and station: Station is normal.   Gait is normal. Tandem gait is mildly wide.. Romberg is negative.   Reflexes: Deep tendon reflexes are symmetric and normal bilaterally.   Plantar responses are flexor.    DIAGNOSTIC DATA (LABS, IMAGING, TESTING) - I reviewed patient records, labs, notes, testing and imaging myself where available.  Lab Results  Component Value Date   WBC 4.5 10/18/2021   HGB 14.0 10/18/2021   HCT 42.2 10/18/2021   MCV 93.4  10/18/2021   PLT 129 (L) 10/18/2021      Component Value Date/Time   NA 140 10/18/2021 1605   K 3.5 10/18/2021 1605   CL 107 10/18/2021 1605   CO2 27 10/18/2021 1605   GLUCOSE 109 (H) 10/18/2021 1605   BUN 11 10/18/2021 1605   CREATININE 0.76 10/18/2021 1605   CREATININE 0.62 06/29/2021 1445   CALCIUM 9.2 10/18/2021 1605   PROT 6.7 10/18/2021 1605   ALBUMIN 4.3 10/18/2021 1605   AST 16 10/18/2021 1605   AST 14 (L) 06/29/2021 1445   ALT 13 10/18/2021 1605   ALT 9 06/29/2021 1445   ALKPHOS 39 10/18/2021 1605   BILITOT 1.4 (H) 10/18/2021 1605   BILITOT 1.0 06/29/2021 1445   GFRNONAA >60 10/18/2021 1605   GFRNONAA >60 06/29/2021 1445   No results found for: CHOL, HDL, LDLCALC, LDLDIRECT, TRIG, CHOLHDL No results found for: OINO6V Lab Results  Component Value Date   VITAMINB12 489 09/28/2021   Lab Results  Component Value Date   TSH 1.74 09/28/2021       ASSESSMENT AND PLAN  Numbness - Plan: MR BRAIN W WO CONTRAST, MR CERVICAL SPINE W WO CONTRAST  Ataxic gait - Plan: MR BRAIN W WO CONTRAST, MR CERVICAL SPINE W WO CONTRAST  Subjective visual disturbance, right eye - Plan: MR BRAIN W WO CONTRAST   In summary, Ms. Machnik is a 26 year old woman who had facial numbness 2 years ago, a visual disturbance starting about 10 months ago, gait difficulties about 6 months ago and additional left-sided numbness 1 month ago.  On exam, she has reduced color vision and a 1+ APD on the right and also had a mildly wide tandem gait and mild sensory changes.  In a patient of her age, the symptoms are concerning for multiple sclerosis and we need to check an MRI of the brain and cervical spine to further evaluate.  I discussed with her that if the imaging is consistent with MS I would want to start a disease modifying therapy.  If there are a couple foci but not completely fulfilling MS criteria we may need to do a lumbar puncture for additional information.  Even if  she has no lesions on MRI  I would want to follow-up with her to repeat another scan about a year later.  None of her symptoms are severe enough to treat at this time.  We will have her come back in a couple weeks to go over the results of the studies appropriately evaluation and may bring her in sooner based on the results.  Thank you for asking me to see Ms. Grandpre.  Please let me know if I can be of further assistance with her or other patients in the future.   Bennie Scaff A. Epimenio FootSater, MD, PhD, FAAN Certified in Neurology, Clinical Neurophysiology, Sleep Medicine, Pain Medicine and Neuroimaging Director, [facility]  [facility] [address], [address] [place], [place] [address] [phone]

## 2021-12-04 ENCOUNTER — Ambulatory Visit: Payer: BC Managed Care – PPO | Admitting: Family Medicine

## 2021-12-06 ENCOUNTER — Telehealth: Payer: Self-pay | Admitting: Neurology

## 2021-12-06 NOTE — Telephone Encounter (Signed)
90 mins MRI brain w/wo & MRI cervical w/wo Dr. Gershon Mussel Berkley Harvey: [id number] exp. 12/06/21-01/04/22 scheduled at [facility] 12/12/21 at 10am

## 2021-12-12 ENCOUNTER — Ambulatory Visit: Payer: BC Managed Care – PPO

## 2021-12-12 DIAGNOSIS — R260 Ataxic gait: Secondary | ICD-10-CM | POA: Diagnosis not present

## 2021-12-12 DIAGNOSIS — H5310 Unspecified subjective visual disturbances: Secondary | ICD-10-CM | POA: Diagnosis not present

## 2021-12-12 DIAGNOSIS — R200 Anesthesia of skin: Secondary | ICD-10-CM | POA: Diagnosis not present

## 2021-12-12 MED ORDER — GADOBENATE DIMEGLUMINE 529 MG/ML IV SOLN
10.0000 mL | Freq: Once | INTRAVENOUS | Status: AC | PRN
  Administered 2021-12-12: 10 mL via INTRAVENOUS

## 2022-04-17 ENCOUNTER — Ambulatory Visit (HOSPITAL_COMMUNITY)
Admission: EM | Admit: 2022-04-17 | Discharge: 2022-04-17 | Disposition: A | Discharge status: Home / Self Care | Payer: Worker's Compensation | Attending: Family Medicine | Admitting: Family Medicine

## 2022-04-17 ENCOUNTER — Encounter (HOSPITAL_COMMUNITY): Payer: Self-pay

## 2022-04-17 ENCOUNTER — Ambulatory Visit (HOSPITAL_COMMUNITY): Payer: BC Managed Care – PPO | Attending: Family Medicine

## 2022-04-17 DIAGNOSIS — M79671 Pain in right foot: Secondary | ICD-10-CM

## 2022-04-17 NOTE — ED Triage Notes (Signed)
Pt reports that rolled couple hundred pounds of meat on a pallet on right foot. Unable to get get foot out so had to roll pallet back off foot. C/o pain more medial foot and unable to move big toe on right foot.

## 2022-04-17 NOTE — Discharge Instructions (Addendum)
Advised patient to RICE affected area of right foot for 30 minutes 3 times daily for the next 3 days.  Advised if right great toe/right foot pain worsens and/or unresolved please follow-up with [facility] orthopedic provider for further evaluation.  Advised if right great toe nail plate issues arise may follow-up with [facility] podiatrist for further evaluation.  Contact information provided below.

## 2022-04-17 NOTE — ED Provider Notes (Signed)
[facility]    CSN: [id number] Arrival date & time: 04/17/22  1243      History   Chief Complaint Chief Complaint  Patient presents with   Foot Injury    HPI Nancy Wallace is a 26 y.o. female.   HPI 26 year old female presents with right foot pain secondary to right foot injury that occurred at work earlier today.  Reports rolling 200 pounds of meat on pallet onto right foot unable to get foot out several pallet back off right foot.  Reports pain is more medial aspect of foot and unable to move right great toe.  PMH significant for ADHD, clotting disorder and tachycardia. Patient is accompanied by her Mother this morning.  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 No pertinent surgical history.  OB History   No obstetric history on file.      Home Medications    Prior to Admission medications   Medication Sig Start Date End Date Taking? Authorizing Provider  ADDERALL XR 20 MG 24 hr capsule Take 20 mg by mouth every morning. Patient not taking: Reported on 09/28/2021 05/31/20   [provider]  amphetamine-dextroamphetamine (ADDERALL) 10 MG tablet Take 10 mg by mouth daily.  Patient not taking: Reported on 09/28/2021 05/29/17   [provider]  Cholecalciferol (VITAMIN D3) 50 MCG (2000 UT) CAPS daily. Patient not taking: Reported on 06/29/2021    [provider]    Family History Family History  Adopted: Yes  Problem Relation Age of Onset   Multiple sclerosis Other     Social History Social History   Tobacco Use   Smoking status: Never    Smokeless tobacco: Never  Vaping Use   Vaping Use: Never used  Substance Use Topics   Alcohol use: Yes   Drug use: Never    Comment: socially     Allergies   Patient has no known allergies.   Review of Systems Review of Systems  Musculoskeletal:        Right foot pain  All other systems reviewed and are negative.    Physical Exam Triage Vital Signs ED Triage Vitals  Enc Vitals Group     BP 04/17/22 1331 132/80     Pulse Rate 04/17/22 1331 85     Resp 04/17/22 1331 18     Temp 04/17/22 1331 98.3 F (36.8 C)     Temp src --      SpO2 04/17/22 1331 98 %     Weight 04/17/22 1247 127 lb (57.6 kg)     Height 04/17/22 1247 5\' 3"  (1.6 m)     Head Circumference --      Peak Flow --      Pain Score 04/17/22 1331 8     Pain Loc --      Pain Edu? --      Excl. in Parch[place]? --    No data found.  Updated Vital Signs BP 132/80   Pulse 85   Temp 98.3 F (36.8 C)  Resp 18   Ht 5\' 3"  (1.6 m)   Wt 127 lb (57.6 kg)   LMP 03/29/2022   SpO2 98%   BMI 22.50 kg/m      Physical Exam Vitals and nursing note reviewed.  Constitutional:      Appearance: Normal appearance. She is normal weight.  HENT:     Head: Normocephalic and atraumatic.     Mouth/Throat:     Mouth: Mucous membranes are moist.     Pharynx: Oropharynx is clear.  Eyes:     Extraocular Movements: Extraocular movements intact.     Conjunctiva/sclera: Conjunctivae normal.     Pupils: Pupils are equal, round, and reactive to light.  Cardiovascular:     Rate and Rhythm: Normal rate and regular rhythm.     Pulses: Normal pulses.     Heart sounds: Normal heart sounds.  Pulmonary:     Effort: Pulmonary effort is normal.     Breath sounds: Normal breath sounds. No wheezing, rhonchi or rales.  Musculoskeletal:        General: Normal range of motion.     Cervical back: Normal range of motion and neck supple.     Comments: Right foot: TTP over dorsum, 1st MTP, and distal aspect of right great toe, with soft tissue  swelling noted.  Skin:    General: Skin is warm and dry.  Neurological:     General: No focal deficit present.     Mental Status: She is alert and oriented to person, place, and time.      UC Treatments / Results  Labs (all labs ordered are listed, but only abnormal results are displayed) Labs Reviewed - No data to display  EKG   Radiology DG Foot Complete Right  Result Date: 04/17/2022 CLINICAL DATA:  Trauma, pain EXAM: RIGHT FOOT COMPLETE - 3+ VIEW COMPARISON:  None Available. FINDINGS: There is no evidence of fracture or dislocation. There is no evidence of arthropathy or other focal bone abnormality. Soft tissues are unremarkable. IMPRESSION: No fracture or dislocation is seen in right foot. Electronically Signed   By: 06/17/2022 M.D.   On: 04/17/2022 14:22    Procedures Procedures (including critical care time)  Medications Ordered in UC Medications - No data to display  Initial Impression / Assessment and Plan / UC Course  I have reviewed the triage vital signs and the nursing notes.  Pertinent labs & imaging results that were available during my care of the patient were reviewed by me and considered in my medical decision making (see chart for details).     MDM: 1.  Right foot pain-right foot xray revealed above-Advised patient to RICE affected area of right foot for 30 minutes 3 times daily for the next 3 days.  Advised if right great toe/right foot pain worsens and/or unresolved please follow-up with [facility] orthopedic provider for further evaluation.  Advised if right great toe nail plate issues arise may follow-up with [facility] podiatrist for further evaluation.  Contact information provided below.  Patient discharged home, hemodynamically stable. Final Clinical Impressions(s) / UC Diagnoses   Final diagnoses:  Right foot pain     Discharge Instructions      Advised patient to RICE affected area of right foot for 30 minutes 3 times daily for  the next 3 days.  Advised if right great toe/right foot pain worsens and/or unresolved please follow-up with [facility] orthopedic provider for further evaluation.  Advised if right great toe nail plate  issues arise may follow-up with [facility] podiatrist for further evaluation.  Contact information provided below.     ED Prescriptions   None    PDMP not reviewed this encounter.   Trevor Iha, FNP 04/17/22 1438

## 2022-05-02 ENCOUNTER — Ambulatory Visit: Payer: BC Managed Care – PPO | Admitting: Family Medicine

## 2022-05-24 ENCOUNTER — Ambulatory Visit (INDEPENDENT_AMBULATORY_CARE_PROVIDER_SITE_OTHER): Payer: BC Managed Care – PPO | Admitting: Family Medicine

## 2022-05-24 ENCOUNTER — Ambulatory Visit: Payer: Self-pay

## 2022-05-24 VITALS — BP 110/76 | Ht 66.0 in | Wt 117.0 lb

## 2022-05-24 DIAGNOSIS — M7751 Other enthesopathy of right foot: Secondary | ICD-10-CM | POA: Diagnosis not present

## 2022-05-24 DIAGNOSIS — M79671 Pain in right foot: Secondary | ICD-10-CM

## 2022-05-24 NOTE — Assessment & Plan Note (Signed)
Acutely occurring.  Initial event was trauma over the forefoot. -counseled on home exercise therapy and supportive care. -Green support insole with metatarsal pad. -Could consider injection.

## 2022-05-24 NOTE — Progress Notes (Signed)
  Nancy Wallace - 26 y.o. female MRN [id number]  Date of birth: [birth year]  SUBJECTIVE:  Including CC & ROS.  No chief complaint on file.   Nancy Wallace is a 26 y.o. female that is presenting with right foot pain.  The pain has been ongoing for a few weeks.  She initially had her foot run over by a pallet jack..  Review of the urgent care note from 10/3 shows she was counseled on supportive care. Independent review of the right foot x-ray from 10/3 shows no acute changes.  Review of Systems See HPI   HISTORY: Past Medical, Surgical, Social, and Family History Reviewed & Updated per EMR.   Pertinent Historical Findings include:  Past Medical History:  Diagnosis Date   ADHD    Anxiety    Clotting disorder (HCC)    Depression    History of ITP    Tachycardia    Vitamin D deficiency     No past surgical history on file.   PHYSICAL EXAM:  VS: BP 110/76   Ht 5\' 6"  (1.676 m)   Wt 117 lb (53.1 kg)   BMI 18.88 kg/m  Physical Exam Gen: NAD, alert, cooperative with exam, well-appearing MSK:  Neurovascularly intact    Limited ultrasound: Right foot:  No changes of the first MTP joint. No changes of the second or third metatarsal shaft. The third MTP joint demonstrates laxity on dynamic testing. No changes of the second or third proximal phalanx  Summary: Findings most consistent with capsulitis of third MTP joint.  Ultrasound and interpretation by , MD    ASSESSMENT & PLAN:   Capsulitis of toe of right foot Acutely occurring.  Initial event was trauma over the forefoot. -counseled on home exercise therapy and supportive care. -Green support insole with metatarsal pad. -Could consider injection.

## 2022-05-24 NOTE — Patient Instructions (Signed)
Nice to meet you Please use the insoles  Please use ice as needed   Please send me a message in MyChart with any questions or updates.  Please see me back by next Wednesday if you're having pain or in 4 weeks.   --Dr. Jordan Likes

## 2022-06-22 ENCOUNTER — Telehealth: Payer: Self-pay | Admitting: *Deleted

## 2022-06-22 NOTE — Telephone Encounter (Signed)
Called and lvm of rescheduling appointment - requested callback to confirm 

## 2022-06-29 ENCOUNTER — Inpatient Hospital Stay: Payer: BC Managed Care – PPO | Attending: Hematology & Oncology | Admitting: Family

## 2022-06-29 ENCOUNTER — Inpatient Hospital Stay: Payer: BC Managed Care – PPO

## 2022-06-29 ENCOUNTER — Other Ambulatory Visit: Payer: Self-pay

## 2022-06-29 ENCOUNTER — Encounter: Payer: Self-pay | Admitting: Family

## 2022-06-29 VITALS — BP 117/76 | HR 90 | Temp 97.8°F | Resp 18 | Ht 66.0 in | Wt 122.0 lb

## 2022-06-29 DIAGNOSIS — Z79899 Other long term (current) drug therapy: Secondary | ICD-10-CM | POA: Insufficient documentation

## 2022-06-29 DIAGNOSIS — R002 Palpitations: Secondary | ICD-10-CM | POA: Insufficient documentation

## 2022-06-29 DIAGNOSIS — D693 Immune thrombocytopenic purpura: Secondary | ICD-10-CM | POA: Diagnosis not present

## 2022-06-29 DIAGNOSIS — R0602 Shortness of breath: Secondary | ICD-10-CM | POA: Diagnosis not present

## 2022-06-29 DIAGNOSIS — D696 Thrombocytopenia, unspecified: Secondary | ICD-10-CM | POA: Insufficient documentation

## 2022-06-29 LAB — CBC WITH DIFFERENTIAL (CANCER CENTER ONLY)
Abs Immature Granulocytes: 0 10*3/uL (ref 0.00–0.07)
Basophils Absolute: 0 10*3/uL (ref 0.0–0.1)
Basophils Relative: 0 %
Eosinophils Absolute: 0 10*3/uL (ref 0.0–0.5)
Eosinophils Relative: 0 %
HCT: 40.4 % (ref 36.0–46.0)
Hemoglobin: 13.2 g/dL (ref 12.0–15.0)
Immature Granulocytes: 0 %
Lymphocytes Relative: 33 %
Lymphs Abs: 1 10*3/uL (ref 0.7–4.0)
MCH: 30.5 pg (ref 26.0–34.0)
MCHC: 32.7 g/dL (ref 30.0–36.0)
MCV: 93.3 fL (ref 80.0–100.0)
Monocytes Absolute: 0.3 10*3/uL (ref 0.1–1.0)
Monocytes Relative: 10 %
Neutro Abs: 1.7 10*3/uL (ref 1.7–7.7)
Neutrophils Relative %: 57 %
Platelet Count: 120 10*3/uL — ABNORMAL LOW (ref 150–400)
RBC: 4.33 MIL/uL (ref 3.87–5.11)
RDW: 11.9 % (ref 11.5–15.5)
WBC Count: 3 10*3/uL — ABNORMAL LOW (ref 4.0–10.5)
nRBC: 0 % (ref 0.0–0.2)

## 2022-06-29 LAB — CMP (CANCER CENTER ONLY)
ALT: 12 U/L (ref 0–44)
AST: 16 U/L (ref 15–41)
Albumin: 4.6 g/dL (ref 3.5–5.0)
Alkaline Phosphatase: 48 U/L (ref 38–126)
Anion gap: 7 (ref 5–15)
BUN: 10 mg/dL (ref 6–20)
CO2: 29 mmol/L (ref 22–32)
Calcium: 9.2 mg/dL (ref 8.9–10.3)
Chloride: 104 mmol/L (ref 98–111)
Creatinine: 0.67 mg/dL (ref 0.44–1.00)
GFR, Estimated: >60 mL/min (ref >60)
Glucose, Bld: 94 mg/dL (ref 70–99)
Potassium: 4.3 mmol/L (ref 3.5–5.1)
Sodium: 140 mmol/L (ref 135–145)
Total Bilirubin: 1.5 mg/dL — ABNORMAL HIGH (ref 0.3–1.2)
Total Protein: 6.8 g/dL (ref 6.5–8.1)

## 2022-06-29 LAB — SAVE SMEAR(SSMR), FOR PROVIDER SLIDE REVIEW

## 2022-06-29 LAB — LACTATE DEHYDROGENASE: LDH: 94 U/L — ABNORMAL LOW (ref 98–192)

## 2022-06-29 NOTE — Progress Notes (Signed)
Hematology and Oncology Follow Up Visit  Nancy Wallace [id number] 05-Sep-1995 26 y.o. 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 She is doing well and recently returned from a wonderful trip to [place].  She states that her cycle is regular with heavy flow. No other blood loss noted.  She states that she recently dropped an 800 lb pallet on her foot and thankfully did not break anything and did not bruise excessively.  She notes occasional SOB and palpitations with over exertion. She will take a break to rest when needed and symptoms will resolve.  No fever, chills, n/v, cough, rash, dizziness, chest pain, abdominal pain or changes in bowel or bladder habits.  No swelling, tenderness, numbness or tingling in her extremities.  No falls or syncope.  Appetite and hydration are good. Weight is stable at 122 lbs.   ECOG Performance Status: 0 - Asymptomatic  Medications:  Allergies as of 06/29/2022   No Known Allergies      Medication List        Accurate as of June 29, 2022  1:03 PM. If you have any questions, ask your nurse or doctor.          amphetamine-dextroamphetamine 10 MG tablet Commonly known as: ADDERALL Take 10 mg by mouth daily.   Adderall XR 20 MG 24 hr capsule Generic drug: amphetamine-dextroamphetamine Take 20 mg by mouth every morning.   vitamin D3 50 MCG (2000 UT) Caps daily.        Allergies: No Known Allergies  Past Medical History, Surgical history, Social history, and Family History were reviewed and updated.  Review of Systems: All other 10 point review of systems is negative.   Physical Exam:  vitals were not taken for this visit.   Wt Readings from Last 3 Encounters:  05/24/22 117 lb (53.1 kg)  04/17/22 127 lb (57.6 kg)  11/30/21 120 lb (54.4 kg)    Ocular: Sclerae  unicteric, pupils equal, round and reactive to light Ear-nose-throat: Oropharynx clear, dentition fair Lymphatic: No cervical or supraclavicular adenopathy Lungs no rales or rhonchi, good excursion bilaterally Heart regular rate and rhythm, no murmur appreciated Abd soft, nontender, positive bowel sounds MSK no focal spinal tenderness, no joint edema Neuro: non-focal, well-oriented, appropriate affect Breasts: Deferred   Lab Results  Component Value Date   WBC 4.5 10/18/2021   HGB 14.0 10/18/2021   HCT 42.2 10/18/2021   MCV 93.4 10/18/2021   PLT 129 (L) 10/18/2021   No results found for: "FERRITIN", "IRON", "TIBC", "UIBC", "IRONPCTSAT" Lab Results  Component Value Date   RBC 4.52 10/18/2021   No results found for: "KPAFRELGTCHN", "LAMBDASER", "KAPLAMBRATIO" No results found for: "IGGSERUM", "IGA", "IGMSERUM" No results found for: "TOTALPROTELP", "ALBUMINELP", "A1GS", "A2GS", "BETS", "BETA2SER", "GAMS", "MSPIKE", "SPEI"   Chemistry      Component Value Date/Time   NA 140 10/18/2021 1605   K 3.5 10/18/2021 1605   CL 107 10/18/2021 1605   CO2 27 10/18/2021 1605   BUN 11 10/18/2021 1605   CREATININE 0.76 10/18/2021 1605   CREATININE 0.62 06/29/2021 1445      Component Value Date/Time   CALCIUM 9.2 10/18/2021 1605   ALKPHOS 39 10/18/2021 1605   AST 16 10/18/2021 1605   AST 14 (L) 06/29/2021 1445   ALT 13 10/18/2021 1605   ALT 9 06/29/2021 1445  BILITOT 1.4 (H) 10/18/2021 1605   BILITOT 1.0 06/29/2021 1445       Impression and Plan: Nancy Wallace is a very pleasant 26 yo caucasian female with history of thrombocytopenia.  Platelets are stable at 120. She continues to do well.  We discussed s/s of low platelets such as heavy blood loss, excessive bruising and petechiae. She will contact our office with any questions or concerns. We can see her sooner if needed.  Follow-up in 1 year.   Eileen Stanford, NP 12/15/20231:03 PM

## 2022-07-23 ENCOUNTER — Other Ambulatory Visit: Payer: Self-pay

## 2022-07-23 ENCOUNTER — Encounter (HOSPITAL_BASED_OUTPATIENT_CLINIC_OR_DEPARTMENT_OTHER): Payer: Self-pay | Admitting: Emergency Medicine

## 2022-07-23 ENCOUNTER — Emergency Department (HOSPITAL_BASED_OUTPATIENT_CLINIC_OR_DEPARTMENT_OTHER): Payer: BC Managed Care – PPO

## 2022-07-23 ENCOUNTER — Emergency Department (HOSPITAL_BASED_OUTPATIENT_CLINIC_OR_DEPARTMENT_OTHER)
Admission: EM | Admit: 2022-07-23 | Discharge: 2022-07-23 | Disposition: A | Discharge status: Home / Self Care | Payer: BC Managed Care – PPO | Attending: Emergency Medicine | Admitting: Emergency Medicine

## 2022-07-23 DIAGNOSIS — H1032 Unspecified acute conjunctivitis, left eye: Secondary | ICD-10-CM | POA: Diagnosis not present

## 2022-07-23 DIAGNOSIS — Z1152 Encounter for screening for COVID-19: Secondary | ICD-10-CM | POA: Insufficient documentation

## 2022-07-23 DIAGNOSIS — R509 Fever, unspecified: Secondary | ICD-10-CM | POA: Diagnosis not present

## 2022-07-23 DIAGNOSIS — J069 Acute upper respiratory infection, unspecified: Secondary | ICD-10-CM | POA: Diagnosis not present

## 2022-07-23 DIAGNOSIS — B9789 Other viral agents as the cause of diseases classified elsewhere: Secondary | ICD-10-CM | POA: Diagnosis not present

## 2022-07-23 LAB — CBC WITH DIFFERENTIAL/PLATELET
Abs Immature Granulocytes: 0.01 10*3/uL (ref 0.00–0.07)
Basophils Absolute: 0 10*3/uL (ref 0.0–0.1)
Basophils Relative: 0 %
Eosinophils Absolute: 0 10*3/uL (ref 0.0–0.5)
Eosinophils Relative: 1 %
HCT: 41.8 % (ref 36.0–46.0)
Hemoglobin: 13.6 g/dL (ref 12.0–15.0)
Immature Granulocytes: 0 %
Lymphocytes Relative: 29 %
Lymphs Abs: 1 10*3/uL (ref 0.7–4.0)
MCH: 30.2 pg (ref 26.0–34.0)
MCHC: 32.5 g/dL (ref 30.0–36.0)
MCV: 92.7 fL (ref 80.0–100.0)
Monocytes Absolute: 0.3 10*3/uL (ref 0.1–1.0)
Monocytes Relative: 8 %
Neutro Abs: 2.1 10*3/uL (ref 1.7–7.7)
Neutrophils Relative %: 62 %
Platelets: 132 10*3/uL — ABNORMAL LOW (ref 150–400)
RBC: 4.51 MIL/uL (ref 3.87–5.11)
RDW: 12.3 % (ref 11.5–15.5)
WBC: 3.5 10*3/uL — ABNORMAL LOW (ref 4.0–10.5)
nRBC: 0 % (ref 0.0–0.2)

## 2022-07-23 LAB — RESP PANEL BY RT-PCR (RSV, FLU A&B, COVID)  RVPGX2
Influenza A by PCR: NEGATIVE
Influenza B by PCR: NEGATIVE
Resp Syncytial Virus by PCR: NEGATIVE
SARS Coronavirus 2 by RT PCR: NEGATIVE

## 2022-07-23 LAB — BASIC METABOLIC PANEL
Anion gap: 10 (ref 5–15)
BUN: 11 mg/dL (ref 6–20)
CO2: 28 mmol/L (ref 22–32)
Calcium: 9.2 mg/dL (ref 8.9–10.3)
Chloride: 103 mmol/L (ref 98–111)
Creatinine, Ser: 0.6 mg/dL (ref 0.44–1.00)
GFR, Estimated: >60 mL/min (ref >60)
Glucose, Bld: 88 mg/dL (ref 70–99)
Potassium: 4.2 mmol/L (ref 3.5–5.1)
Sodium: 141 mmol/L (ref 135–145)

## 2022-07-23 MED ORDER — OFLOXACIN 0.3 % OP SOLN: OPHTHALMIC | 0 refills | Status: DC

## 2022-07-23 MED ORDER — TETRACAINE HCL 0.5 % OP SOLN
1.0000 [drp] | Freq: Once | OPHTHALMIC | Status: DC
Start: 2022-07-23 — End: 2022-07-23
  Filled 2022-07-23: qty 4

## 2022-07-23 NOTE — Discharge Instructions (Signed)
You are seen today for redness to your left eye and upper respiratory symptoms.  You are negative for COVID flu and RSV.  Your blood work showed a slightly decreased white blood cell count -which can be seen in the setting of a virus- but overall is reassuring.  You are given antibiotic drops for your eye infection.  Make sure you throw away that contact and do not use contacts again until you have finished the drops and the infection is gone.  If you have new or worsening symptoms come back right away otherwise follow-up with your primary care doctor.

## 2022-07-23 NOTE — ED Provider Notes (Signed)
[facility] EMERGENCY DEPT Provider Note   CSN: [id number] Arrival date & time: 07/23/22  1148     History  Chief Complaint  Patient presents with   Fever   Conjunctivitis    Nancy Wallace is a 27 y.o. female.  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  She comes in for 5 days of intermittent fever, cough.  Came in today because she had redness and purulent drainage to the left eye.  Denies any foreign body sensation, denies injury to the eye.  She wears contact lenses, she has the lens out today but denies any changes in her vision.  She denies any photophobia or headache.  She states cough was initially purulent and had some bloody streaks but now is cleared.   Fever Conjunctivitis       Home Medications Prior to Admission medications   Medication Sig Start Date End Date Taking? Authorizing Provider  ofloxacin (OCUFLOX) 0.3 % ophthalmic solution 1 drop in the left eye every 4 hours for the first 2 days, then 4 times a day for the next 3 days for a total of 5 days. 07/23/22  Yes Shandee Jergens A, PA-C  ADDERALL XR 20 MG 24 hr capsule Take 20 mg by mouth every morning. 05/31/20   [provider]  amphetamine-dextroamphetamine (ADDERALL) 10 MG tablet Take 10 mg by mouth daily. 05/29/17   [provider]  Cholecalciferol (VITAMIN D3) 50 MCG (2000 UT) CAPS daily.    [provider]      Allergies    Patient has no known allergies.    Review of Systems   Review of Systems  Constitutional:  Positive for fever.  Respiratory:  Negative for chest tightness.     Physical Exam Updated Vital Signs BP 109/78 (BP Location: Right Arm)   Pulse 86   Temp 98.3 F (36.8 C) (Oral)   Resp 16   Ht 5\' 6"  (1.676 m)   Wt 54.4 kg   SpO2 100%   BMI 19.37 kg/m  Physical Exam Vitals and nursing note reviewed.  Constitutional:      General: She is not in  acute distress.    Appearance: She is well-developed.  HENT:     Head: Normocephalic and atraumatic.  Eyes:     General: Lids are normal.     Intraocular pressure: Left eye pressure is 16 mmHg. Measurements were taken using a handheld tonometer.    Extraocular Movements: Extraocular movements intact.     Conjunctiva/sclera:     Left eye: Left conjunctiva is injected. No chemosis, exudate or hemorrhage. Cardiovascular:     Rate and Rhythm: Normal rate and regular rhythm.     Heart sounds: No murmur heard. Pulmonary:     Effort: Pulmonary effort is normal. No respiratory distress.     Breath sounds: Normal breath sounds. No wheezing or rales.  Abdominal:     Palpations: Abdomen is soft.     Tenderness: There is no abdominal tenderness.  Musculoskeletal:        General: No swelling.     Cervical back: Neck supple.  Skin:    General: Skin is warm and dry.     Capillary Refill: Capillary refill takes less than 2 seconds.  Neurological:     Mental Status: She is alert.  Psychiatric:        Mood and Affect: Mood normal.  ED Results / Procedures / Treatments   Labs (all labs ordered are listed, but only abnormal results are displayed) Labs Reviewed  CBC WITH DIFFERENTIAL/PLATELET - Abnormal; Notable for the following components:      Result Value   WBC 3.5 (*)    Platelets 132 (*)    All other components within normal limits  RESP PANEL BY RT-PCR (RSV, FLU A&B, COVID)  [id number]  BASIC METABOLIC PANEL    EKG None  Radiology DG Chest Portable 1 View  Result Date: 07/23/2022 CLINICAL DATA:  Fever, conjunctivitis EXAM: PORTABLE CHEST 1 VIEW COMPARISON:  06/26/2020 FINDINGS: The heart size and mediastinal contours are within normal limits. Both lungs are clear. The visualized skeletal structures are unremarkable. IMPRESSION: No active disease. Electronically Signed   By: Jerilynn Mages.  Shick M.D.   On: 07/23/2022 13:52    Procedures Procedures    Medications Ordered in  ED Medications  tetracaine (PONTOCAINE) 0.5 % ophthalmic solution 1 drop (has no administration in time range)    ED Course/ Medical Decision Making/ A&P                           Medical Decision Making This patient presents to the ED for concern of left eye redness with drainage and cough with sinus congestion, this involves an extensive number of treatment options, and is a complaint that carries with it a high risk of complications and morbidity.  The differential diagnosis includes conjunctivitis viral and bacterial, sinusitis, bronchitis, pneumonia, URI, other   Co morbidities that complicate the patient evaluation  ITP   Additional history obtained:  Additional history obtained from EMR External records from outside source obtained and reviewed including outpatient hemotology notes   Lab Tests:  I Ordered, and personally interpreted labs.  The pertinent results include: CBC-mild leukopenia, mild thrombocytopenia in line with patient's baseline.  COVID flu and RSV testing   Imaging Studies ordered:  I ordered imaging studies including chest x-ray I independently visualized and interpreted imaging which showed no pulmonary edema or infiltrate I agree with the radiologist interpretation    Problem List / ED Course / Critical interventions / Medication management  Conjunctivitis-patient has left eye redness and states she had copious amounts of purulent drainage this morning.  On exam she has conjunctival irritation and injection but no significant findings, no pain or foreign body sensation.  No concern for corneal abrasion.  Intraocular pressure was performed because patient states she felt like the eye felt like a slight amount of pressure in it.  She had no blurred vision changes, intraocular pressure was normal.  Given her recent viral symptoms this could be viral conjunctivitis but will treat her with antibiotics due to the purulent drainage at home.  I did consider  corneal ulcer given that she uses contacts but she has no pain or vision changes so do not feel this is at all likely currently.  She is going to be put on antibiotic drops and it was advised on close outpatient follow-up.  URI-likely viral given patient with symptoms.  She was negative for COVID flu and RSV and did CBC and BMP which were reassuring and chest x-ray which showed no signs of pneumonia.  Her symptoms elevated for 5 days.  She had green nasal drainage when she blows her nose with some blood on and states same material comes out when she coughs and feels like she is coughing up from her throat.  Lungs  are clear to auscultation.  I do not think she has pneumonia.  Exam shows some grade show on the anterior the left naris is likely the cause of the bloody drainage when she blows her nose or coughs.  I discussed if her fevers are more persistent she needs to follow-up exam.  She is only at day 5 and does not have any significant sinus tenderness or swelling.  Symptoms have been worsening.  I still feel is likely viral, do not need antibiotics at this time but she was given strict return precautions and follow-up instructions.  I have reviewed the patients home medicines and have made adjustments as needed      Amount and/or Complexity of Data Reviewed Labs: ordered. Radiology: ordered.  Risk Prescription drug management.           Final Clinical Impression(s) / ED Diagnoses Final diagnoses:  Acute conjunctivitis of left eye, unspecified acute conjunctivitis type  Viral upper respiratory tract infection    Rx / DC Orders ED Discharge Orders          Ordered    ofloxacin (OCUFLOX) 0.3 % ophthalmic solution        07/23/22 [address] A, PA-C 07/23/22 1544    Horton, Alvin Critchley, DO 07/23/22 1556

## 2022-07-23 NOTE — ED Triage Notes (Signed)
Pt arrives to ED with c/o fever, sore throat x5 days and today developed left eye redness and drainage.

## 2022-07-23 NOTE — ED Notes (Signed)
Pt verbalized understanding of d/c instructions, meds, and followup care. Denies questions. VSS, no distress noted. Steady gait to exit with all belongings.  ?

## 2022-08-26 IMAGING — CT CT HEAD W/O CM
3 series · 15 of 47 positions shown, 18 images · non-contrast
Comparison: None.

CLINICAL DATA: Severe headache, paresthesias



[Series 3: head 5.0 h30s · axial · 0.42mm/px · z∈[-123,+2]mm · 9 of 30 slices shown, 12 images]
[im 3/30  brain]
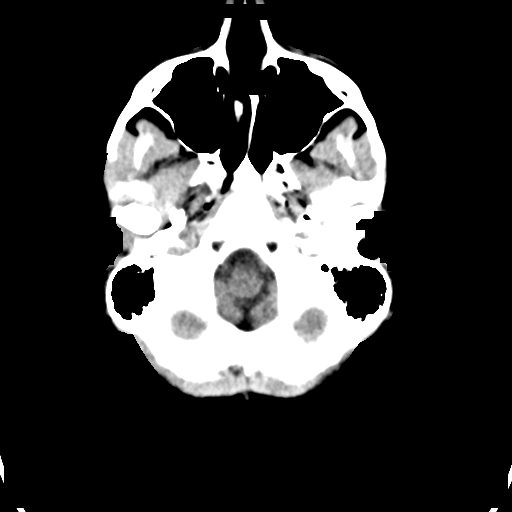
[im 3/30  bone]
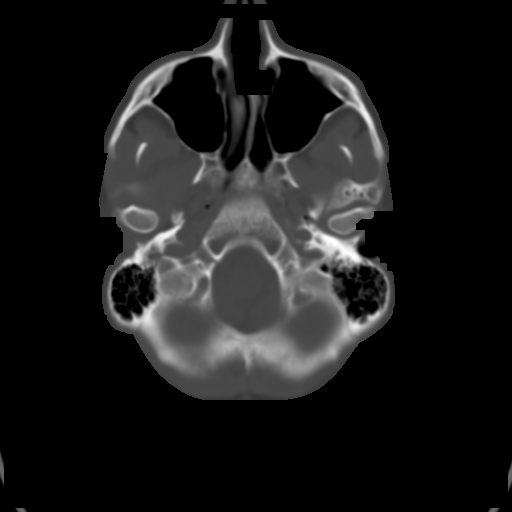
[im 6/30  brain]
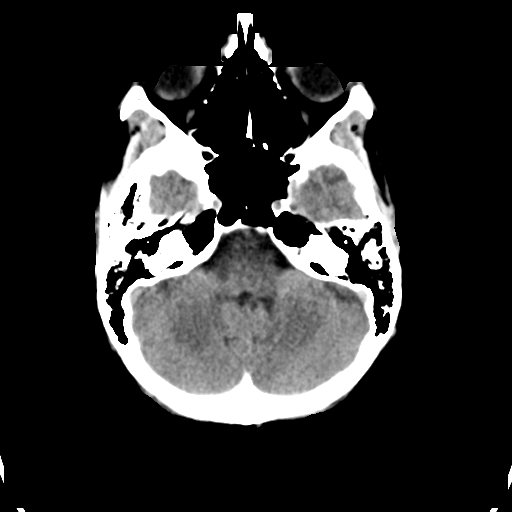
[im 9/30  brain]
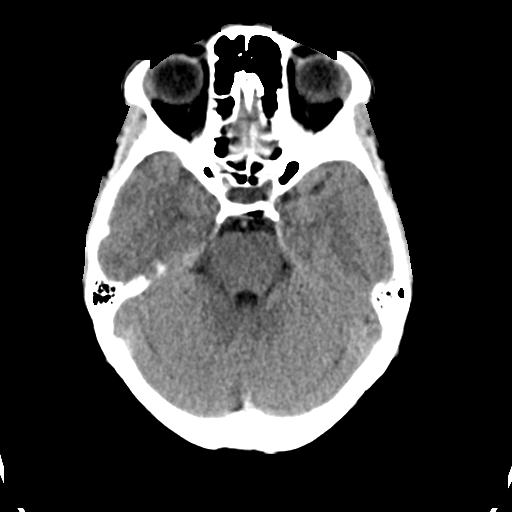
[im 12/30  brain]
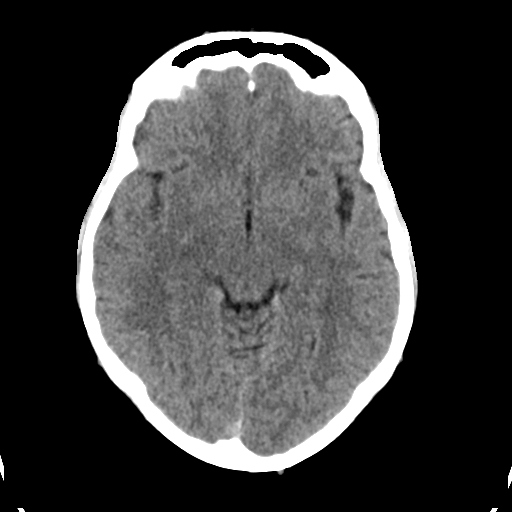
[im 16/30  brain]
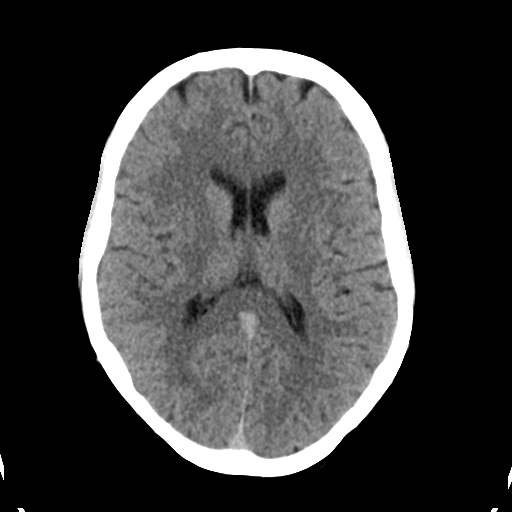
[im 16/30  bone]
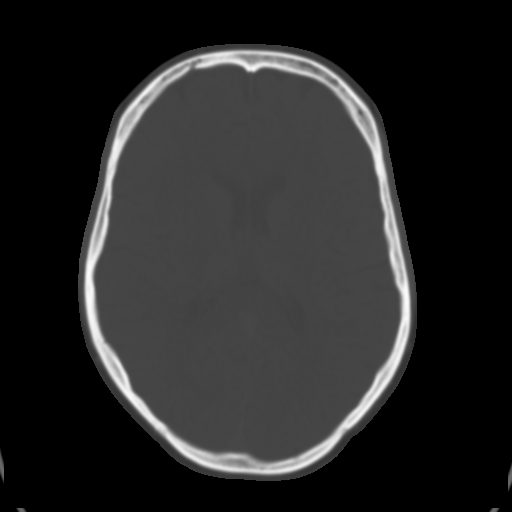
[im 19/30  brain]
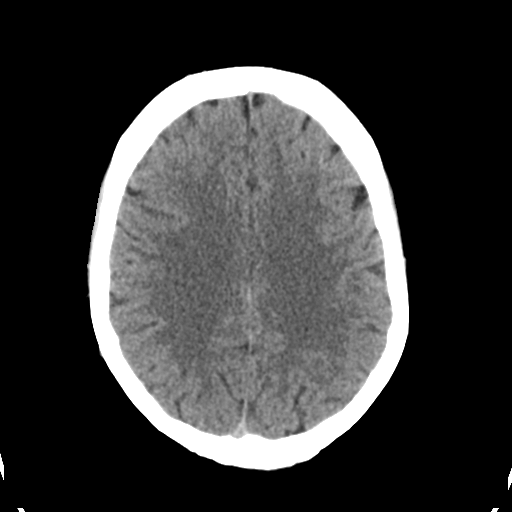
[im 22/30  brain]
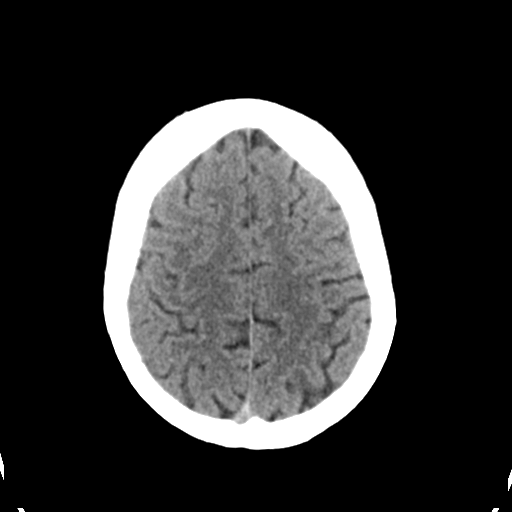
[im 25/30  brain]
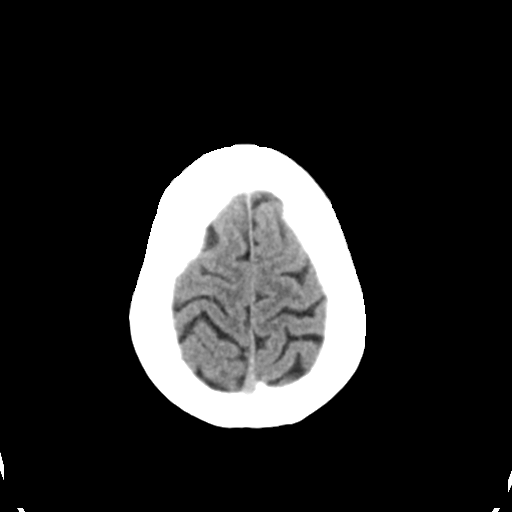
[im 28/30  brain]
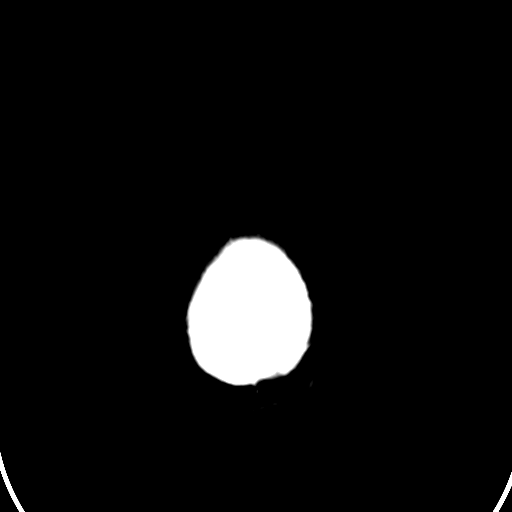
[im 28/30  bone]
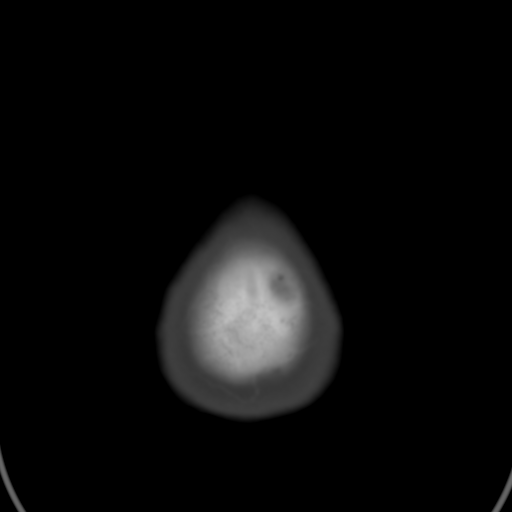

[Series 5: head 3.0 mpr cor · coronal · 0.31mm/px · 3 of 67 slices shown]
[im 23/67  brain]
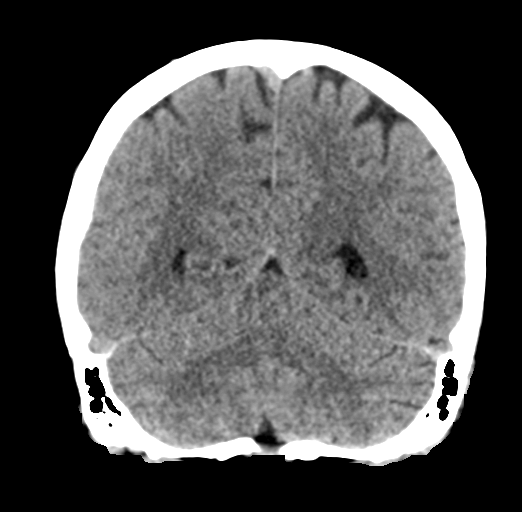
[im 30/67  brain]
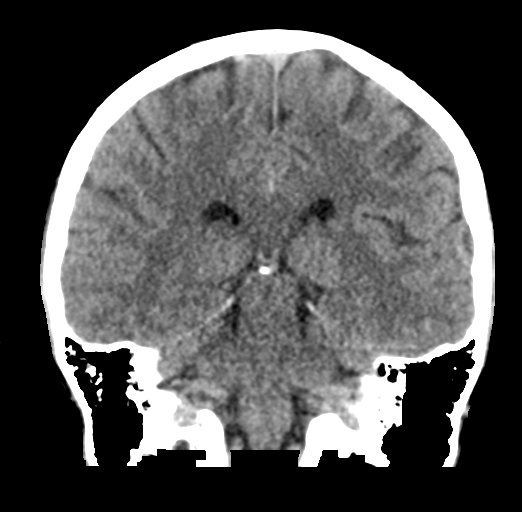
[im 37/67  brain]
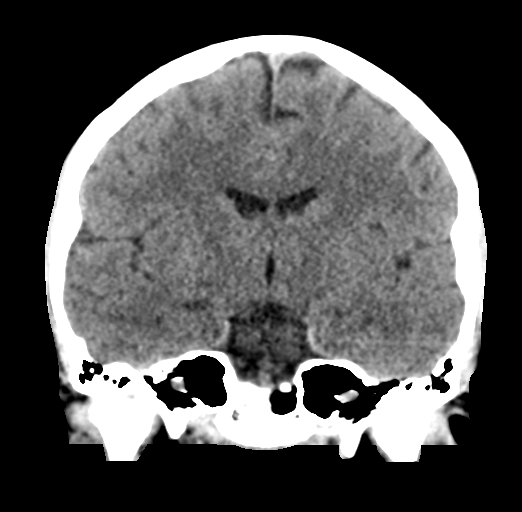

[Series 6: head 3.0 mpr sag · sagittal · 0.29mm/px · 3 of 60 slices shown]
[im 20/60  brain]
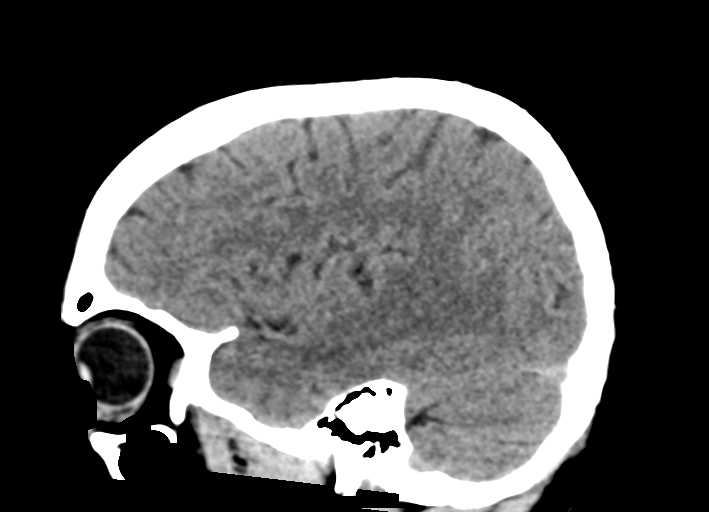
[im 30/60  brain]
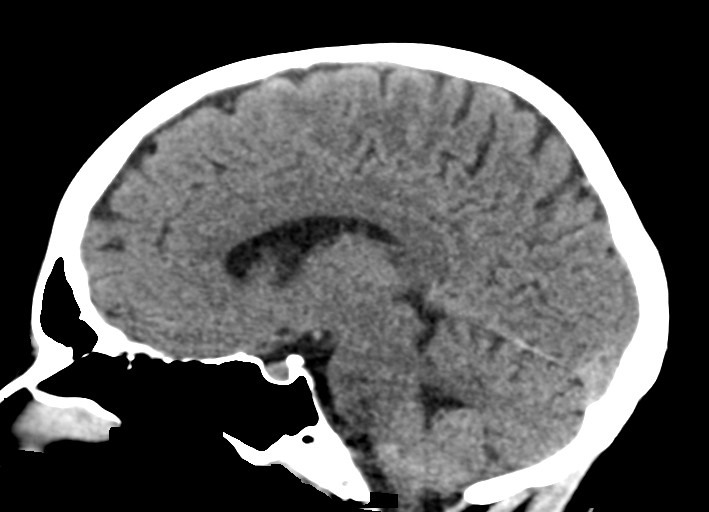
[im 40/60  brain]
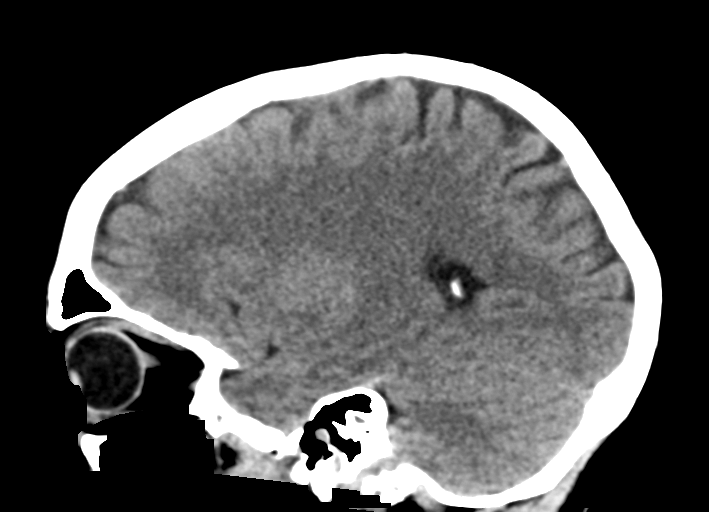

[15 of 47 positions shown; findings below may reference images not displayed]

FINDINGS: Brain: No evidence of acute infarction, hemorrhage, hydrocephalus,
extra-axial collection or mass lesion/mass effect.

Vascular: No hyperdense vessel or unexpected calcification.

Skull: Normal. Negative for fracture or focal lesion.

Sinuses/Orbits: No acute finding.

Other: None.
IMPRESSION: Normal head CT without contrast for age

## 2022-10-29 ENCOUNTER — Encounter: Payer: Self-pay | Admitting: *Deleted

## 2023-04-03 ENCOUNTER — Telehealth: Payer: Self-pay

## 2023-04-03 NOTE — Telephone Encounter (Signed)
We received a new referral for a patient previously seen by Dr Epimenio Foot in 2023 for a work up for possible MS. That diagnosis was ruled out by Dr Epimenio Foot and no follow ups were made after the initial visit. The patient is now being referred by Dr Nicholes Rough for possible neuralgia. The patient is c/o pressure and tingling in the maxillary left quadrant x3 years. She prefers to see a female provider and is requesting a provider switch to Dr Lucia Gaskins.  The paper referral is scanned in under media in the patients chart. Please advise if this switch is acceptable.  Thank you!

## 2023-04-06 NOTE — Telephone Encounter (Signed)
Yes that is fine. I am scheduling out rather far, please mentiont his to her and maybe she would prefer to get another appointment with a female doctor who has osmthing sooner but I am happy to see her thank you

## 2023-04-26 ENCOUNTER — Ambulatory Visit (INDEPENDENT_AMBULATORY_CARE_PROVIDER_SITE_OTHER): Payer: BC Managed Care – PPO | Admitting: Neurology

## 2023-04-26 ENCOUNTER — Encounter: Payer: Self-pay | Admitting: Neurology

## 2023-04-26 VITALS — BP 135/86 | HR 106 | Ht 66.0 in | Wt 117.0 lb

## 2023-04-26 DIAGNOSIS — G4089 Other seizures: Secondary | ICD-10-CM

## 2023-04-26 DIAGNOSIS — R200 Anesthesia of skin: Secondary | ICD-10-CM

## 2023-04-26 DIAGNOSIS — M5416 Radiculopathy, lumbar region: Secondary | ICD-10-CM

## 2023-04-26 DIAGNOSIS — R29898 Other symptoms and signs involving the musculoskeletal system: Secondary | ICD-10-CM | POA: Diagnosis not present

## 2023-04-26 DIAGNOSIS — R202 Paresthesia of skin: Secondary | ICD-10-CM | POA: Diagnosis not present

## 2023-04-26 DIAGNOSIS — G500 Trigeminal neuralgia: Secondary | ICD-10-CM | POA: Diagnosis not present

## 2023-04-26 DIAGNOSIS — G501 Atypical facial pain: Secondary | ICD-10-CM

## 2023-04-26 NOTE — Patient Instructions (Addendum)
Emg/ncs; Ulnar neuropathy?  EEG routine and then a long term eeg MRI trigeminal nervew  "Movement disorder" paroxysmal dystonia??? Lumbar radiculopathy? Mri Lumbar Spine at a later date  Electromyoneurogram Electromyoneurogram is a test to check how well your muscles and nerves are working. This procedure includes the combined use of electromyogram (EMG) and nerve conduction study (NCS). EMG is used to evaluate muscles and the nerves that control those muscles. NCS, which is also called electroneurogram, measures how well your nerves conduct electricity. The procedures should be done together to check if your muscles and nerves are healthy. If the results of the tests are abnormal, this may indicate disease or injury, such as a neuromuscular disease or peripheral nerve damage. Tell a health care provider about: Any allergies you have. All medicines you are taking, including vitamins, herbs, eye drops, creams, and over-the-counter medicines. Any bleeding problems you have. Any surgeries you have had. Any medical conditions you have. What are the risks? Generally, this is a safe procedure. However, problems may occur, including: Bleeding or bruising. Infection where the electrodes were inserted. What happens before the test? Medicines Take all of your usually prescribed medications before this testing is performed. Do not stop your blood thinners unless advised by your prescribing physician. General instructions Your health care provider may ask you to warm the limb that will be checked with warm water, hot pack, or wrapping the limb in a blanket. Do not use lotions or creams on the same day that you will be having the procedure. What happens during the test? For EMG  Your health care provider will ask you to stay in a position so that the muscle being studied can be accessed. You will be sitting or lying down. You may be given a medicine to numb the area (local anesthetic) and the skin  will be disinfected. A very thin needle that has an electrode will be inserted into your muscle, one muscle at a time. Typically, multiple muscles are evaluated during a single study. Another small electrode will be placed on your skin near the muscle. Your health care provider will ask you to continue to remain still. The electrodes will record the electrical activity of your muscles. You may see this on a monitor or hear it in the room. After your muscles have been studied at rest, your health care provider will ask you to contract or flex your muscles. The electrodes will record the electrical activity of your muscles. Your health care provider will remove the electrodes and the electrode needle when the procedure is finished. The procedure may vary among health care providers and hospitals. For NCS  An electrode that records your nerve activity (recording electrode) will be placed on your skin by the muscle that is being studied. An electrode that is used as a reference (reference electrode) will be placed near the recording electrode. A paste or gel will be applied to your skin between the recording electrode and the reference electrode. Your nerve will be stimulated with a mild shock. The speed of the nerves and strength of response is recorded by the electrodes. Your health care provider will remove the electrodes and the gel when the procedure is finished. The procedure may vary among health care providers and hospitals. What can I expect after the test? It is up to you to get your test results. Ask your health care provider, or the department that is doing the test, when your results will be ready. Your health care provider may:  Give you medicines for any pain. Monitor the insertion sites to make sure that bleeding stops. You should be able to drive yourself to and from the test. Discomfort can persist for a few hours after the test, but should be better the next day. Contact a health  care provider if: You have swelling, redness, or drainage at any of the insertion sites. Summary Electromyoneurogram is a test to check how well your muscles and nerves are working. If the results of the tests are abnormal, this may indicate disease or injury. This is a safe procedure. However, problems may occur, such as bleeding and infection. Your health care provider will do two tests to complete this procedure. One checks your muscles (EMG) and another checks your nerves (NCS). It is up to you to get your test results. Ask your health care provider, or the department that is doing the test, when your results will be ready. This information is not intended to replace advice given to you by your health care provider. Make sure you discuss any questions you have with your health care provider. Document Revised: 03/15/2021 Document Reviewed: 02/12/2021 Elsevier Patient Education  2024 [employer]  Paroxysmal dystonia (PD) is a neurological disorder that causes abnormal, repetitive muscle contractions in the body or face. These contractions can result in painful twisting movements or abnormal postures. PD episodes can last from seconds to hours, and can occur multiple times per day or very infrequently.  Paroxysmal dystonia Symptoms Abnormal, repetitive muscle contractions that can cause twisting movements or abnormal postures Triggers Sudden movement, fatigue, coffee, alcohol, loud noise, hyperventilation Duration Episodes can last from seconds to hours Frequency Some people experience many attacks per day, while others experience them very infrequently Treatment A neurologist can diagnose and treat PD, which may include drugs, injections, and physical therapy PD can be a movement disorder associated with multiple sclerosis (MS). It can also be classified into types based on what triggers the attacks, including: Paroxysmal kinesigenic dyskinesia (PKD): Induced by sudden  movement Paroxysmal non-kinesigenic dyskinesia: Occurs spontaneously Paroxysmal exertion-induced dyskinesia: Appears after prolonged exercise Paroxysmal hypnogenic dyskinesia: Occurs during sleep  Some forms of PD are genetic, while others can occur sporadically

## 2023-04-26 NOTE — Progress Notes (Signed)
[facility]  PATIENT: Nancy Wallace DOB: 1996/03/14  CC: paresthesias   04/25/2024: 27 year old patient here with her mother who also provides much information. 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  She reports Tingling after dental surgery radiating into just in the face in the V2/V3 distribution. She also has tingling in into the left arm and radiation in the left leg. When she gets the radiation into the leg, cannot move the toes move the toes. Symptoms may not happen together. No headache. No migraines. The face and the arm and the leg symptoms have happened at the same time but sometimes just the face more often than not. In the past few years also symptoms down the leg amd arm. The MRI c-spine was normal and she was having the symptoms before then. She was evaluated for MS by my colleague Dr. Epimenio Wallace in 2023 which was negative. She has seen a psychologist and the symptoms are not anxiety related. It is random. Can last 3 days. Face is tingling and numb like it fell aslep and continuous not triggered by chewing or brushing.Not triggered by neck mvement. But when she moves her neck she does feel it in the left arm. She has tremor but that may be becaue she gets anxious when it happens. She has had weakness in the Wallace and couldn't move it (I reviewed the video). She went off her ADHD medication to see if that was affecting her and was not. Symptoms At least twice a week. Mostly her face. She has had cramps in her left in the past. And radiation into the digits 4-5 of the left leg. Radiation down back of leg. From the back all the ways down to the toes. No other focal neurologic deficits, associated symptoms, inciting events or modifiable factors.   Reviwed images of the head and neck MRIs and agree with the following:  STUDY  DATE:  12/12/2021 ...  Impression  This is a normal MRI of the cervical spine with and without contrast.   Patient complains of symptoms per HPI as well as the following symptoms: none . Pertinent negatives and positives per HPI. All others negative   REFERRING DOCTOR OR PCP: Nancy Hopes,  NP SOURCE: Patient, notes from primary care, imaging and lab reports, CT scan images personally reviewed.  _________________________________  Radiates down the HISTORICAL  CHIEF COMPLAINT: Dr. Epimenio Wallace 11/2021 Chief Complaint  Patient presents with   New Patient (Initial Visit)    Rm 5. Patient with mom, reports for the past 3 years tingling on the left side starting in face going down neck and arm and recently the leg as well, only on left side. Had an MRI maybe a year ago.     HISTORY OF PRESENT ILLNESS:  I had the pleasure seeing your patient, Nancy Wallace, at the [facility] for neurologic consultation regarding her numbness, visual changes and fatigue and concern about multiple sclerosis.  Ms. Nancy Wallace is a 27 year old woman who noted the onset of left facial tingling starting late 2021.   Although somewhat improved, the numbness has not completely resolved.    In April 2023,  she had the onset of more numbness going into her neck and then arm and to a lesser extent into her left leg.   She went to the urgent care at [facility].  A head CT was performed and was normal.    She was discharged.   Summer 2022, she had a lot of OD pain that worsened with eye movements.   She did not note a visual disturbance at that time.   However, she did note altered vision with mild graying of her vision, worse out of right eye.   She saw ophthalmology and reduced color vision was noted OD.    Her ophthalmologist told her he was concerned about optic neuritis.      Gait is doing worse since November or December 2022.  She can now only walk 45 minutes without a break and is slower than  last year.  Gait was unlimited previously.  She notes mild reduced balance issues going uphill or upstairs.     She started to use the bannister on stairs late last year when she first noted the balance and gait issues.  She notes urinary urgency started sometime during the second half of 2022, possibly the same time as balance issues.  She is much more fatigued the last year.   She feels happy but also feels depressed mood at times.    She has situational anxiety and sees therapy.    She has ADHD diagnosed in third grade.   She stopped her stimulant due to palpitations.   She is in school Writer).   She finds writing thoughts more difficulty.   She currently sleeps well but had insomnia in the past when taking more classes.     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    Platelets are 90-120 usually and she has not required treatment.     She is adopted but knows some of her family history.   She has a maternal aunt with MS .   Her biologic sister died with a brain tumor as a toddler.    Patient complains of symptoms per HPI as well as the following symptoms: none . Pertinent negatives and positives per HPI. All others negative  ALLERGIES: No Known Allergies  HOME MEDICATIONS: No current outpatient medications on file.  PAST MEDICAL HISTORY: Past Medical History:  Diagnosis Date   ADHD    Anxiety    Clotting disorder (HCC)    Depression    History of ITP    Tachycardia    Vitamin D deficiency     PAST SURGICAL HISTORY: Past Surgical History:  Procedure Laterality Date   NO PAST SURGERIES      FAMILY HISTORY: Family History  Adopted: Yes  Problem Relation Age of Onset   Multiple sclerosis Other    Migraines Neg Hx     SOCIAL HISTORY:  Social History   Socioeconomic History   Marital status: Single    Spouse name: Not on file   Number of children: Not on file   Years of education: Not on file   Highest education level: Some college, no degree   Occupational History   Not on file  Tobacco Use   Smoking status: Never   Smokeless tobacco: Never  Vaping Use   Vaping status: Never Used  Substance and Sexual Activity   Alcohol use: Yes   Drug use: Never    Comment: socially   Sexual activity: Yes  Other Topics Concern   Not on file  Social History Narrative   Lives alone   R handed   Caffeine:rare   Social Determinants of Health   Financial Resource Strain: Not on file  Food Insecurity: Not on file  Transportation Needs: Not on file  Physical Activity: Not on file  Stress: Not on file  Social Connections: Not on file  Intimate Partner Violence: Not on file     PHYSICAL EXAM  Vitals:   04/26/23 1035  BP: 135/86  Pulse: (!) 106  Weight: 117 lb (53.1 kg)  Height: 5\' 6"  (1.676 m)    Body mass index is 18.88 kg/m.  Physical exam: Exam: Gen: NAD, conversant, well nourised, obese, well groomed                     CV: RRR, no MRG. No Carotid Bruits. No peripheral edema, warm, nontender Eyes: Conjunctivae clear without exudates or hemorrhage  Neuro: Detailed Neurologic Exam  Speech:    Speech is normal; fluent and spontaneous with normal comprehension.  Cognition:    The patient is oriented to person, place, and time;     recent and remote memory intact;     language fluent;     normal attention, concentration,     fund of knowledge Cranial Nerves:    The pupils are equal, round, and reactive to light. The fundi are normal and spontaneous venous pulsations are present. Visual fields are full to finger confrontation. Extraocular movements are intact. Trigeminal sensation is intact and the muscles of mastication are normal. The face is symmetric. The palate elevates in the midline. Hearing intact. Voice is normal. Shoulder shrug is normal. The tongue has normal motion without fasciculations.   Coordination:    Normal finger to nose and heel to shin. Normal rapid alternating movements.   Gait:    Heel-toe  and tandem gait are normal.   Motor Observation:    No asymmetry, no atrophy, and no involuntary movements noted. Tone:    Normal muscle tone.    Posture:    Posture is normal. normal erect    Strength: Weakness left leg hip flexion and left leg flexion. Otherwise strength is V/V in the upper and lower limbs.      Sensation: intact to LT     Reflex Exam:  DTR's:    Deep tendon reflexes in the upper and lower extremities are normal bilaterally.   Toes:    The toes are downgoing bilaterally.   Clonus:    Clonus is absent.    DIAGNOSTIC DATA (LABS, IMAGING, TESTING) - I reviewed patient records, labs, notes, testing and imaging myself where available.  Lab Results  Component Value Date   WBC 3.5 (L) 07/23/2022   HGB 13.6 07/23/2022   HCT 41.8 07/23/2022   MCV 92.7 07/23/2022   PLT 132 (L) 07/23/2022      Component Value Date/Time   NA 141 07/23/2022 1333   K 4.2 07/23/2022 1333   CL 103 07/23/2022 1333   CO2 28 07/23/2022 1333   GLUCOSE 88 07/23/2022 1333   BUN 11 07/23/2022 1333   CREATININE 0.60 07/23/2022 1333   CREATININE 0.67 06/29/2022 1256   CALCIUM 9.2 07/23/2022 1333   PROT 6.8 06/29/2022 1256   ALBUMIN 4.6 06/29/2022 1256   AST 16 06/29/2022 1256   ALT 12 06/29/2022 1256   ALKPHOS 48 06/29/2022 1256   BILITOT 1.5 (H) 06/29/2022 1256   GFRNONAA >60 07/23/2022 1333   GFRNONAA >60 06/29/2022 1256   No results found for: "CHOL", "HDL", "LDLCALC", "LDLDIRECT", "TRIG", "CHOLHDL" No results found for: "HGBA1C" Lab Results  Component Value Date   VITAMINB12 489 09/28/2021   Lab Results  Component Value Date   TSH 1.74 09/28/2021       ASSESSMENT AND PLAN: Patient with atypical left-sided facial numbness, tingling in V2/V3 distribution may be TGN needs MRI Face/Head trigeminal protocol. Also radiation of the left arm into digits 4-5, will evaluate for Ulnar Neuropathy with emg/ncs. Left Leg with radiation in the L5/S1 distribution will also add this  to emg/ncs.   Paresthesias - Plan: NCV with EMG(electromyography), EEG adult  Paresthesias in left hand - Plan: NCV with EMG(electromyography)  evaluate for Lumbar radiculopathy - Plan: NCV with EMG(electromyography)  evaluate for Trigeminal neuralgia - Plan: MR FACE/TRIGEMINAL WO/W CM  Numbness and tingling of left side of face - Plan: MR FACE/TRIGEMINAL WO/W CM  Weakness of left leg - Plan: NCV with EMG(electromyography)  Left hand weakness - Plan: NCV with EMG(electromyography)  evaluate for Sensory seizure (HCC)  Atypical facial pain - Plan: MR FACE/TRIGEMINAL WO/W CM  EMG/NCS of the left upper and left lower (verify prior to NCS) Emg/ncs; Ulnar neuropathy?  Other entrapment disorder/ Evaluate EEG routine and then a long term eeg: to evaluate for sensory seizure due to left-sided paresthesias MRI trigeminal nerve: atypical left facial pain Toes, Wallace will not move episodically: Movement disorder, paroxysmal dystonia, Lumbar radiculopathy? Mri Lumbar Spine at a later date, emg/ncs first   Naomie Dean, MD  [facility] [address], [address] [place], [place] [address] [phone]  I spent over 50 minutes of face-to-face and non-face-to-face time with patient on the  1. Paresthesias   2. Paresthesias in left hand   3. evaluate for Lumbar radiculopathy   4. evaluate for Trigeminal neuralgia   5. Numbness and tingling of left side of face   6. Weakness of left leg   7. Left hand weakness   8. evaluate for Sensory seizure (HCC)   9. Atypical facial pain    diagnosis.  This included previsit chart review, lab review, study review, order entry, electronic health record documentation, patient education on the different diagnostic and therapeutic options, counseling and coordination of care, risks and benefits of management, compliance, or risk factor reduction

## 2023-04-29 ENCOUNTER — Encounter: Payer: Self-pay | Admitting: Neurology

## 2023-05-07 ENCOUNTER — Ambulatory Visit (INDEPENDENT_AMBULATORY_CARE_PROVIDER_SITE_OTHER): Payer: BC Managed Care – PPO

## 2023-05-07 DIAGNOSIS — G500 Trigeminal neuralgia: Secondary | ICD-10-CM

## 2023-05-07 DIAGNOSIS — R200 Anesthesia of skin: Secondary | ICD-10-CM | POA: Diagnosis not present

## 2023-05-07 DIAGNOSIS — G501 Atypical facial pain: Secondary | ICD-10-CM

## 2023-05-07 DIAGNOSIS — R202 Paresthesia of skin: Secondary | ICD-10-CM

## 2023-05-07 MED ORDER — GADOBENATE DIMEGLUMINE 529 MG/ML IV SOLN
10.0000 mL | Freq: Once | INTRAVENOUS | Status: AC | PRN
  Administered 2023-05-07: 10 mL via INTRAVENOUS

## 2023-05-09 ENCOUNTER — Telehealth: Payer: Self-pay

## 2023-05-09 DIAGNOSIS — G500 Trigeminal neuralgia: Secondary | ICD-10-CM

## 2023-05-09 DIAGNOSIS — G501 Atypical facial pain: Secondary | ICD-10-CM

## 2023-05-09 DIAGNOSIS — R200 Anesthesia of skin: Secondary | ICD-10-CM

## 2023-05-09 NOTE — Telephone Encounter (Signed)
-----   Message from Anson Fret sent at 05/09/2023 12:42 PM EDT ----- Call and discuss thanks: The MRi trigeminal nerve showed a blood vessel near the trigeminal nerve. Could be impacting it. I often send people to [facility] bc they are the best at assessing that. I'll ask my team to call you and if you want to explore it further I can refer you to dr laxton and we will have to send a CD with your imaging to him and have you carry it with you as well. thanks

## 2023-05-09 NOTE — Telephone Encounter (Signed)
IF PT RETURNS CALL, ROUTE TO POD 4   Contacted pt, LVM rq call back. Advised  will also send [facility] msg

## 2023-05-15 NOTE — Telephone Encounter (Signed)
I spoke with the Nancy Wallace. She is amenable to proceed with a referral to Dr Tempie Donning at [facility] to assess the blood vessel/trigeminal nerve findings on MRI. Nancy Wallace aware she will need to get the CD of her imaging to take with her to the appointment.  I told her I would ask medical records about this.  I encouraged her to give it 2 weeks and if she does not hear from [facility] to please follow-up with [place] so we can call them.  She verbalized appreciation.   Referral to Dr Tempie Donning [facility] placed per v.o. Dr Lucia Gaskins.

## 2023-05-15 NOTE — Addendum Note (Signed)
Addended by: Bertram Savin on: 05/15/2023 05:39 PM   Modules accepted: Orders

## 2023-05-16 ENCOUNTER — Telehealth: Payer: Self-pay | Admitting: Neurology

## 2023-05-16 NOTE — Telephone Encounter (Signed)
Referral  for neurosurgery fax to [facility]. Phone: [phone], Fax: [phone] 

## 2023-06-05 ENCOUNTER — Ambulatory Visit (INDEPENDENT_AMBULATORY_CARE_PROVIDER_SITE_OTHER): Payer: BC Managed Care – PPO | Admitting: Neurology

## 2023-06-05 DIAGNOSIS — R202 Paresthesia of skin: Secondary | ICD-10-CM

## 2023-06-05 DIAGNOSIS — R4182 Altered mental status, unspecified: Secondary | ICD-10-CM

## 2023-06-05 NOTE — Procedures (Signed)
    History:  27 year old woman with paroxysmal events concerning for seizures  EEG classification: Awake and drowsy  Duration: 26 minutes  Technical aspects: This EEG study was done with scalp electrodes positioned according to the 10-20 International system of electrode placement. Electrical activity was reviewed with band pass filter of 1-70Hz , sensitivity of 7 uV/mm, display speed of 32mm/sec with a 60Hz  notched filter applied as appropriate. EEG data were recorded continuously and digitally stored.   Description of the recording: The background rhythms of this recording consists of a fairly well modulated medium amplitude alpha rhythm of 10 Hz that is reactive to eye opening and closure. Present in the anterior head region is a 15-20 Hz beta activity. Photic stimulation was performed, did not show any abnormalities. Hyperventilation was also performed, showed bursts of high amplitude theta activity lasting a second. Drowsiness was manifested by background fragmentation. No abnormal epileptiform discharges seen during this recording. There was no focal slowing. There were no electrographic seizure identified.   Abnormality: None   Impression: This is a normal EEG recorded while drowsy and awake. No evidence of interictal epileptiform discharges. Normal EEGs, however, do not rule out epilepsy.    Windell Norfolk, MD [facility]

## 2023-06-06 ENCOUNTER — Ambulatory Visit (INDEPENDENT_AMBULATORY_CARE_PROVIDER_SITE_OTHER): Payer: Self-pay | Admitting: Neurology

## 2023-06-06 ENCOUNTER — Telehealth: Payer: Self-pay | Admitting: *Deleted

## 2023-06-06 ENCOUNTER — Ambulatory Visit (INDEPENDENT_AMBULATORY_CARE_PROVIDER_SITE_OTHER): Payer: BC Managed Care – PPO | Admitting: Neurology

## 2023-06-06 DIAGNOSIS — R200 Anesthesia of skin: Secondary | ICD-10-CM

## 2023-06-06 DIAGNOSIS — R202 Paresthesia of skin: Secondary | ICD-10-CM

## 2023-06-06 DIAGNOSIS — R29898 Other symptoms and signs involving the musculoskeletal system: Secondary | ICD-10-CM

## 2023-06-06 DIAGNOSIS — G8929 Other chronic pain: Secondary | ICD-10-CM

## 2023-06-06 DIAGNOSIS — Z0289 Encounter for other administrative examinations: Secondary | ICD-10-CM

## 2023-06-06 DIAGNOSIS — M5416 Radiculopathy, lumbar region: Secondary | ICD-10-CM

## 2023-06-06 DIAGNOSIS — M7918 Myalgia, other site: Secondary | ICD-10-CM

## 2023-06-06 MED ORDER — GABAPENTIN 100 MG PO CAPS: 100.0000 mg | ORAL_CAPSULE | Freq: Three times a day (TID) | ORAL | 6 refills | Status: DC | PRN

## 2023-06-06 NOTE — Patient Instructions (Addendum)
   PT for arm and leg, will also ask for dry needling Gabapentin for nerve pain can start at bedtime. Could also try a muscle relaxer like flexeril at bedtime.  [facility] for TGN will send to sports medicine bc they do a lot of musculoskeletal work: Nancy Wallace, if no improvement with PT and MRI l-spine unrevealing. 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

## 2023-06-06 NOTE — Telephone Encounter (Signed)
-----   Message from Anson Fret sent at 06/05/2023  7:17 PM EST ----- Pod 4: Routine EEG was normal. Can you call and ask if she had the symptoms during the routine EEG? If she did then we do not have to order an extended eeg. If she did not have the symptoms during the routine eeg we probably need a longer eeg thanks

## 2023-06-06 NOTE — Telephone Encounter (Signed)
Called pt & LVM (ok per DPR) asking for call back or mychart message back to discuss EEG results and next steps.

## 2023-06-06 NOTE — Progress Notes (Signed)
Full Name: Nancy Wallace Gender: Female MRN #: [id number] Date of Birth: 1996/06/01    Visit Date: 06/06/2023 10:20 Age: 27 Years Examining Physician: Dr. Naomie Dean Referring Physician: Dr. Naomie Dean Height: 5 feet 6 inch  History: Left sided arm and leg sensory symptoms. EMG/NCS is normal. Will send to PT and order MRI lumbar spine. Prescribe gabapentin.  She has been under the care of multiple doctors for her low back pain with radiculopathy, conservative measures greater than 3 months have not improved symptoms, EMG nerve conduction study did not reveal etiology, recommend MRI of the lumbar spine and physical therapy.will send to sports medicine bc they do a lot of musculoskeletal work: Nancy Wallace, if no improvement with PT and MRI l-spine unrevealing.  Orders Placed This Encounter  Procedures   MR LUMBAR SPINE WO CONTRAST   Ambulatory referral to Physical Therapy   Meds ordered this encounter  Medications   gabapentin (NEURONTIN) 100 MG capsule    Sig: Take 1 capsule (100 mg total) by mouth 3 (three) times daily as needed.    Dispense:  90 capsule    Refill:  6      Summary: EMG/NCS performed on the left upper extremity and left lower extremity. All nerves and muscles (as indicated in the following tables) were within normal limits.   Conclusion: This is a normal study of the left upper and left lower extremities. Recommend MRI lumbar spine.     ------------------------------- Naomie Dean, M.D.  [facility] [address], [address] [place], [place] [address] Tel: [phone] Fax: [phone]  Verbal informed consent was obtained from the patient, patient was informed of potential risk of procedure, including bruising, bleeding, hematoma formation, infection, muscle weakness, muscle pain, numbness, among others.        MNC    Nerve / Sites Muscle Latency Ref. Amplitude Ref. Rel Amp Segments Distance Velocity Ref. Area    ms ms mV  mV %  cm m/s m/s mVms  L Median - APB     Wrist APB 3.3 <=4.4 12.5 >=4.0 100 Wrist - APB 7   42.9     Upper arm APB 6.9  12.4  99.4 Upper arm - Wrist 23 63 >=49 43.4  L Ulnar - ADM     Wrist ADM 2.5 <=3.3 11.3 >=6.0 100 Wrist - ADM 7   35.5     B.Elbow ADM 4.1  11.7  103 B.Elbow - Wrist 10.6 67 >=49 32.2     A.Elbow ADM 7.1  11.4  98.1 A.Elbow - B.Elbow 20 65 >=49 35.6  L Peroneal - EDB     Ankle EDB 4.8 <=6.5 6.0 >=2.0 100 Ankle - EDB 9   20.1     Fib head EDB 10.3  5.7  94.5 Fib head - Ankle 24 44 >=44 20.5     Pop fossa EDB 11.9  8.2  144 Pop fossa - Fib head 10 62 >=44 26.8         Pop fossa - Ankle      L Tibial - AH     Ankle AH 3.7 <=5.8 23.9 >=4.0 100 Ankle - AH 9   54.1     Pop fossa AH 10.8  14.1  58.9 Pop fossa - Ankle 35 50 >=41 38.0             SNC    Nerve / Sites Rec. Site Peak Lat Ref.  Amp Ref. Segments Distance  Peak Diff Ref.    ms ms V V  cm ms ms  L Sural - Ankle (Calf)     Calf Ankle 3.8 <=4.4 10 >=6 Calf - Ankle 14    L Superficial peroneal - Ankle     Lat leg Ankle 3.9 <=4.4 6 >=6 Lat leg - Ankle 14    L Median, Ulnar - Transcarpal comparison     Median Palm Wrist 2.0 <=2.2 45 >=35 Median Palm - Wrist 8       Ulnar Palm Wrist 2.0 <=2.2 21 >=12 Ulnar Palm - Wrist 8          Median Palm - Ulnar Palm  0.1 <=0.4  L Median - Orthodromic (Dig II, Mid palm)     Dig II Wrist 2.8 <=3.4 17 >=10 Dig II - Wrist 13    L Ulnar - Orthodromic, (Dig V, Mid palm)     Dig V Wrist 2.6 <=3.1 13 >=5 Dig V - Wrist 52                 F  Wave    Nerve F Lat Ref.   ms ms  L Tibial - AH 44.3 <=56.0  L Ulnar - ADM 24.8 <=32.0         EMG Summary Table    Spontaneous MUAP Recruitment  Muscle IA Fib PSW Fasc Other Amp Dur. Poly Pattern  L. Deltoid Normal None None None _______ Normal Normal Normal Normal  L. Triceps brachii Normal None None None _______ Normal Normal Normal Normal  L. Pronator teres Normal None None None _______ Normal Normal Normal Normal  L. First dorsal  interosseous Normal None None None _______ Normal Normal Normal Normal  L. Vastus medialis Normal None None None _______ Normal Normal Normal Normal  L. Tibialis anterior Normal None None None _______ Normal Normal Normal Normal  L. Gastrocnemius (Medial head) Normal None None None _______ Normal Normal Normal Normal  L. Extensor hallucis longus Normal None None None _______ Normal Normal Normal Normal  L. Biceps femoris (long head) Normal None None None _______ Normal Normal Normal Normal  L. Gluteus maximus Normal None None None _______ Normal Normal Normal Normal  L. Gluteus medius Normal None None None _______ Normal Normal Normal Normal  L. Cervical paraspinals (low) Normal None None None _______ Normal Normal Normal Normal  L. Lumbar paraspinals (low) Normal None None None _______ Normal Normal Normal Normal

## 2023-06-09 NOTE — Procedures (Signed)
Full Name: Nancy Wallace Gender: Female MRN #: [id number] Date of Birth: 1996/02/19    Visit Date: 06/06/2023 10:20 Age: 27 Years Examining Physician: Dr. Naomie Dean Referring Physician: Dr. Naomie Dean Height: 5 feet 6 inch  History: Left sided arm and leg sensory symptoms. EMG/NCS is normal. Will send to PT and order MRI lumbar spine. Prescribe gabapentin.  She has been under the care of multiple doctors for her low back pain with radiculopathy, conservative measures greater than 3 months have not improved symptoms, EMG nerve conduction study did not reveal etiology, recommend MRI of the lumbar spine and physical therapy.will send to sports medicine bc they do a lot of musculoskeletal work: [name], if no improvement with PT and MRI l-spine unrevealing.  Orders Placed This Encounter  Procedures   MR LUMBAR SPINE WO CONTRAST   Ambulatory referral to Physical Therapy   Meds ordered this encounter  Medications   gabapentin (NEURONTIN) 100 MG capsule    Sig: Take 1 capsule (100 mg total) by mouth 3 (three) times daily as needed.    Dispense:  90 capsule    Refill:  6      Summary: EMG/NCS performed on the left upper extremity and left lower extremity. All nerves and muscles (as indicated in the following tables) were within normal limits.   Conclusion: This is a normal study of the left upper and left lower extremities. Recommend MRI lumbar spine.     ------------------------------- Naomie Dean, M.D.  [facility] [address], [address] [place], [place] [address] Tel: [phone] Fax: [phone]  Verbal informed consent was obtained from the patient, patient was informed of potential risk of procedure, including bruising, bleeding, hematoma formation, infection, muscle weakness, muscle pain, numbness, among others.        MNC    Nerve / Sites Muscle Latency Ref. Amplitude Ref. Rel Amp Segments Distance Velocity Ref. Area    ms ms mV  mV %  cm m/s m/s mVms  L Median - APB     Wrist APB 3.3 <=4.4 12.5 >=4.0 100 Wrist - APB 7   42.9     Upper arm APB 6.9  12.4  99.4 Upper arm - Wrist 23 63 >=49 43.4  L Ulnar - ADM     Wrist ADM 2.5 <=3.3 11.3 >=6.0 100 Wrist - ADM 7   35.5     B.Elbow ADM 4.1  11.7  103 B.Elbow - Wrist 10.6 67 >=49 32.2     A.Elbow ADM 7.1  11.4  98.1 A.Elbow - B.Elbow 20 65 >=49 35.6  L Peroneal - EDB     Ankle EDB 4.8 <=6.5 6.0 >=2.0 100 Ankle - EDB 9   20.1     Fib head EDB 10.3  5.7  94.5 Fib head - Ankle 24 44 >=44 20.5     Pop fossa EDB 11.9  8.2  144 Pop fossa - Fib head 10 62 >=44 26.8         Pop fossa - Ankle      L Tibial - AH     Ankle AH 3.7 <=5.8 23.9 >=4.0 100 Ankle - AH 9   54.1     Pop fossa AH 10.8  14.1  58.9 Pop fossa - Ankle 35 50 >=41 38.0             SNC    Nerve / Sites Rec. Site Peak Lat Ref.  Amp Ref. Segments Distance  Peak Diff Ref.    ms ms V V  cm ms ms  L Sural - Ankle (Calf)     Calf Ankle 3.8 <=4.4 10 >=6 Calf - Ankle 14    L Superficial peroneal - Ankle     Lat leg Ankle 3.9 <=4.4 6 >=6 Lat leg - Ankle 14    L Median, Ulnar - Transcarpal comparison     Median Palm Wrist 2.0 <=2.2 45 >=35 Median Palm - Wrist 8       Ulnar Palm Wrist 2.0 <=2.2 21 >=12 Ulnar Palm - Wrist 8          Median Palm - Ulnar Palm  0.1 <=0.4  L Median - Orthodromic (Dig II, Mid palm)     Dig II Wrist 2.8 <=3.4 17 >=10 Dig II - Wrist 13    L Ulnar - Orthodromic, (Dig V, Mid palm)     Dig V Wrist 2.6 <=3.1 13 >=5 Dig V - Wrist 4                 F  Wave    Nerve F Lat Ref.   ms ms  L Tibial - AH 44.3 <=56.0  L Ulnar - ADM 24.8 <=32.0         EMG Summary Table    Spontaneous MUAP Recruitment  Muscle IA Fib PSW Fasc Other Amp Dur. Poly Pattern  L. Deltoid Normal None None None _______ Normal Normal Normal Normal  L. Triceps brachii Normal None None None _______ Normal Normal Normal Normal  L. Pronator teres Normal None None None _______ Normal Normal Normal Normal  L. First dorsal  interosseous Normal None None None _______ Normal Normal Normal Normal  L. Vastus medialis Normal None None None _______ Normal Normal Normal Normal  L. Tibialis anterior Normal None None None _______ Normal Normal Normal Normal  L. Gastrocnemius (Medial head) Normal None None None _______ Normal Normal Normal Normal  L. Extensor hallucis longus Normal None None None _______ Normal Normal Normal Normal  L. Biceps femoris (long head) Normal None None None _______ Normal Normal Normal Normal  L. Gluteus maximus Normal None None None _______ Normal Normal Normal Normal  L. Gluteus medius Normal None None None _______ Normal Normal Normal Normal  L. Cervical paraspinals (low) Normal None None None _______ Normal Normal Normal Normal  L. Lumbar paraspinals (low) Normal None None None _______ Normal Normal Normal Normal

## 2023-06-10 NOTE — Progress Notes (Signed)
See procedure note.

## 2023-06-11 ENCOUNTER — Telehealth: Payer: Self-pay | Admitting: *Deleted

## 2023-06-11 ENCOUNTER — Telehealth: Payer: Self-pay | Admitting: Neurology

## 2023-06-11 NOTE — Telephone Encounter (Signed)
I left her a voice mail with my direct number and sent her a message in [place].

## 2023-06-11 NOTE — Telephone Encounter (Signed)
Pt is asking for a call to discuss rescheduling her MRI due to a schedule conflict

## 2023-06-11 NOTE — Telephone Encounter (Signed)
-----  Message from Anson Fret sent at 06/05/2023  7:17 PM EST ----- Pod 4: Routine EEG was normal. Can you call and ask if she had the symptoms during the routine EEG? If she did then we do not have to order an extended eeg. If she did not have the symptoms during the routine eeg we probably need a longer eeg thanks

## 2023-06-11 NOTE — Telephone Encounter (Signed)
Spoke to pt gave EEG results Pt wants to proceed with extended EEG Pt wants to reschedule MRI will make tori aware to call patient back and  reschedule and forward to Dr Lucia Gaskins to continue with extended EEG

## 2023-06-12 NOTE — Telephone Encounter (Signed)
I spoke to the patient and she wants to keep her appointment.

## 2023-06-16 ENCOUNTER — Encounter: Payer: Self-pay | Admitting: Neurology

## 2023-06-17 NOTE — Telephone Encounter (Signed)
Order filled out and placed in Dr Lucia Gaskins box to be signed

## 2023-06-17 NOTE — Telephone Encounter (Signed)
AON With [employer]. Thank you

## 2023-06-17 NOTE — Telephone Encounter (Signed)
EEG order faxed to [facility]. Received a receipt of confirmation. [phone]

## 2023-06-17 NOTE — Telephone Encounter (Signed)
Please fill out the order for extended EEG and Dr. Teresa Coombs will read. Dr. Teresa Coombs, where should we order?

## 2023-06-18 ENCOUNTER — Ambulatory Visit: Payer: BC Managed Care – PPO | Attending: Neurology

## 2023-06-18 DIAGNOSIS — M6281 Muscle weakness (generalized): Secondary | ICD-10-CM | POA: Insufficient documentation

## 2023-06-18 DIAGNOSIS — M7918 Myalgia, other site: Secondary | ICD-10-CM | POA: Insufficient documentation

## 2023-06-18 DIAGNOSIS — M5442 Lumbago with sciatica, left side: Secondary | ICD-10-CM | POA: Insufficient documentation

## 2023-06-18 DIAGNOSIS — R29818 Other symptoms and signs involving the nervous system: Secondary | ICD-10-CM | POA: Diagnosis not present

## 2023-06-18 DIAGNOSIS — R29898 Other symptoms and signs involving the musculoskeletal system: Secondary | ICD-10-CM | POA: Diagnosis not present

## 2023-06-18 DIAGNOSIS — G8929 Other chronic pain: Secondary | ICD-10-CM | POA: Diagnosis not present

## 2023-06-18 DIAGNOSIS — M5416 Radiculopathy, lumbar region: Secondary | ICD-10-CM | POA: Diagnosis not present

## 2023-06-18 NOTE — Therapy (Addendum)
OUTPATIENT PHYSICAL THERAPY NEURO EVALUATION   Patient Name: Nancy Wallace MRN: [id number] DOB:1995/12/25, 27 y.o., female Today's Date: 06/18/2023   PCP: Clayborne Dana, NP  REFERRING PROVIDER: Anson Fret, MD  END OF SESSION:  PT End of Session - 06/18/23 1191     Visit Number 1    Number of Visits 5    Date for PT Re-Evaluation 07/19/23    Authorization Type BCBS    PT Start Time 0850    PT Stop Time 0923    PT Time Calculation (min) 33 min    Activity Tolerance Patient tolerated treatment well    Behavior During Therapy WFL for tasks assessed/performed             Past Medical History:  Diagnosis Date   ADHD    Anxiety    Clotting disorder (HCC)    Depression    History of ITP    Tachycardia    Vitamin D deficiency    Past Surgical History:  Procedure Laterality Date   NO PAST SURGERIES     Patient Active Problem List   Diagnosis Date Noted   Capsulitis of toe of right foot 05/24/2022   Numbness 11/30/2021   Ataxic gait 11/30/2021   Subjective visual disturbance, right eye 11/30/2021   Palpitations 12/20/2020   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    ONSET DATE: Referral 06/09/23  REFERRING DIAG:  Diagnosis  M54.16 (ICD-10-CM) - Lumbar radiculopathy  R29.898 (ICD-10-CM) - Weakness of left leg  M54.16 (ICD-10-CM) - Lumbar radiculopathy, chronic  M54.42,G89.29 (ICD-10-CM) - Chronic left-sided low back pain with left-sided sciatica  M79.18 (ICD-10-CM) - Cervical myofascial pain syndrome    THERAPY DIAG:  Muscle weakness (generalized)  Other symptoms and signs involving the musculoskeletal system  Other symptoms and signs involving the nervous system  Rationale for Evaluation and Treatment: Rehabilitation  SUBJECTIVE:                                                                                                                                                                                              SUBJECTIVE STATEMENT: Patient presented to physical therapy evaluation alone with no AD. First started going to neurologist for tingling in her face that is now radiating down her left UE, left side of her spine and down lateral left LE. Describes as tingling and making her "want to crawl out of her skin". Muscles feel weak during these episodes. Feels like she has to concentrate when walking during these episodes. Its been about 3 years but past 3 months have been worse. At least light tingling every day,  heavier episodes last 30 min to one hour and occur about once a week. Has not noticed anything pre cursing episodes to set it off. Used to go for walks but has pulled back and reported not being able to keep up with her family. Resorted to using a delivery service for groceries and has partner carry heavy objects (ex. suitcases) up the stairs for her.   Pt accompanied by: self  PERTINENT HISTORY: anxiety, depression, clotting disorder   PAIN:  Are you having pain? No  PRECAUTIONS: Fall  RED FLAGS: None   WEIGHT BEARING RESTRICTIONS: No  FALLS: Has patient fallen in last 6 months? No  LIVING ENVIRONMENT: Lives with: lives alone Lives in: House/apartment Stairs: Yes: External: 2 flights steps; on right going up Has following equipment at home: None  PLOF: Independent  PATIENT GOALS: "to not feel like I'm crawling out of my skin anymore"   OBJECTIVE:  Note: Objective measures were completed at Evaluation unless otherwise noted.  DIAGNOSTIC FINDINGS:  05/07/23 - MRI Face MR face / trigeminal (with and without) demonstrating: - Small normal appearing vascular structure noted near the left trigeminal nerve cisternal segment. - Remainder of brain parenchyma and brainstem structures are unremarkable.  MRI Lumbar Spine - scheduled for 06/19/23  COGNITION: Overall cognitive status: Within functional limits for tasks assessed   SENSATION: Light touch: Impaired    COORDINATION: Intact  EDEMA:  None noted   POSTURE: No Significant postural limitations  LOWER EXTREMITY ROM:    WFL   LOWER EXTREMITY MMT:    MMT Right Eval Left Eval  Hip flexion 4 4  Hip extension    Hip abduction 4 4  Hip adduction 4 4  Hip internal rotation    Hip external rotation    Knee flexion 5 5  Knee extension 5 5  Ankle dorsiflexion 5 4  Ankle plantarflexion    Ankle inversion    Ankle eversion    (Blank rows = not tested)  BED MOBILITY:  Independent    STAIRS: Level of Assistance: Complete Independence Stair Negotiation Technique: Alternating Pattern  with Single Rail on Right Comments: reports difficulty when carrying heavy objects up  GAIT: Gait pattern: WFL Distance walked: clinic distances  Assistive device utilized: None Level of assistance: Complete Independence   FUNCTIONAL TESTS:   5xSTS 10 sec, w/o UE support  920', independent    TODAY'S TREATMENT:                                                                                                                                EVAL   PATIENT EDUCATION: Education details: PT exam fidnings, POC length  Person educated: Patient Education method: Explanation Education comprehension: verbalized understanding  HOME EXERCISE PROGRAM: To be provided  GOALS: Goals reviewed with patient? Yes  SHORT TERM GOALS: Target date: same as LTG    LONG TERM GOALS: Target date: 07/19/2023  Patient will be independent with  HEP for improved strength and quality of life.  Baseline: to be provided  Goal status: INITIAL  2.  Patient will increase to 1200' for improved community ambulation distances.  Baseline: 920' Goal status: INITIAL  3.  Patient will report independence with carrying groceries up the stairs to her apartment.  Baseline: reported unable  Goal status: INITIAL    ASSESSMENT:  CLINICAL IMPRESSION: Patient is a 27 y.o. female who was seen today for  physical therapy evaluation and treatment for tingling and weakness in her L UE and L LE. Manual muscle testing showed mild difference in strength from R LE to L LE. Strength is within functional range but could be improved for increased quality of life. Decreased sensation to light touch on L LE. ROM limited in L UE due to previous surgery as a child. Patient is able to ambulate independently and go up and down stairs with reciprocal pattern with handrails. Waiting on lumbar MRI results. Patient would benefit from skilled physical therapy to increase strength and provide patient with necessary tools to improve her independence and quality of life as she is 27 years old.   OBJECTIVE IMPAIRMENTS: decreased activity tolerance, decreased balance, decreased strength, and impaired sensation.   ACTIVITY LIMITATIONS: carrying, lifting, stairs, and locomotion level  PARTICIPATION LIMITATIONS: community activity and occupation  PERSONAL FACTORS: Age and Time since onset of injury/illness/exacerbation are also affecting patient's functional outcome.   REHAB POTENTIAL: Good  CLINICAL DECISION MAKING: Stable/uncomplicated  EVALUATION COMPLEXITY: Low  PLAN:  PT FREQUENCY: 1-2x/week  PT DURATION: 4 weeks  PLANNED INTERVENTIONS: 97164- PT Re-evaluation, 97110-Therapeutic exercises, 97530- Therapeutic activity, [id number]- Neuromuscular re-education, 97535- Self Care, 53664- Manual therapy, [phone]- Gait training, [phone]- Aquatic Therapy, Patient/Family education, Balance training, Stair training, Spinal manipulation, and Spinal mobilization  PLAN FOR NEXT SESSION: MRI results? Build HEP, LE strength, Lumbar stretch   Jordy Hewins M Jazel Nimmons, SPT  Westley Foots, PT, DPT, CBIS  06/18/2023, 9:59 AM

## 2023-06-18 NOTE — Addendum Note (Signed)
Addended by: Merry Lofty A on: 06/18/2023 10:20 AM   Modules accepted: Orders

## 2023-06-19 ENCOUNTER — Ambulatory Visit (INDEPENDENT_AMBULATORY_CARE_PROVIDER_SITE_OTHER): Payer: BC Managed Care – PPO

## 2023-06-19 DIAGNOSIS — M5442 Lumbago with sciatica, left side: Secondary | ICD-10-CM

## 2023-06-19 DIAGNOSIS — R29898 Other symptoms and signs involving the musculoskeletal system: Secondary | ICD-10-CM | POA: Diagnosis not present

## 2023-06-19 DIAGNOSIS — G8929 Other chronic pain: Secondary | ICD-10-CM

## 2023-06-19 DIAGNOSIS — M5416 Radiculopathy, lumbar region: Secondary | ICD-10-CM

## 2023-06-19 DIAGNOSIS — R519 Headache, unspecified: Secondary | ICD-10-CM | POA: Diagnosis not present

## 2023-06-19 DIAGNOSIS — R202 Paresthesia of skin: Secondary | ICD-10-CM

## 2023-06-24 ENCOUNTER — Ambulatory Visit: Payer: BC Managed Care – PPO | Admitting: Physical Therapy

## 2023-06-24 NOTE — Therapy (Incomplete)
OUTPATIENT PHYSICAL THERAPY NEURO TREATMENT   Patient Name: Nancy Wallace MRN: [id number] DOB:1995/07/24, 27 y.o., female Today's Date: 06/24/2023   PCP: Clayborne Dana, NP  REFERRING PROVIDER: Anson Fret, MD  END OF SESSION:    Past Medical History:  Diagnosis Date   ADHD    Anxiety    Clotting disorder (HCC)    Depression    History of ITP    Tachycardia    Vitamin D deficiency    Past Surgical History:  Procedure Laterality Date   NO PAST SURGERIES     Patient Active Problem List   Diagnosis Date Noted   Capsulitis of toe of right foot 05/24/2022   Numbness 11/30/2021   Ataxic gait 11/30/2021   Subjective visual disturbance, right eye 11/30/2021   Palpitations 12/20/2020   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  Rationale for Evaluation and Treatment: Rehabilitation  SUBJECTIVE:                                                                                                                                                                                             SUBJECTIVE STATEMENT: ***  Pt accompanied by: self  PERTINENT HISTORY: anxiety, depression, clotting disorder   PAIN:  Are you having pain? No  PRECAUTIONS: Fall  RED FLAGS: None   WEIGHT BEARING RESTRICTIONS: No  FALLS: Has patient fallen in last 6 months? No  LIVING ENVIRONMENT: Lives with: lives alone Lives in: House/apartment Stairs: Yes: External: 2 flights steps; on right going up Has following equipment at home: None  PLOF: Independent  PATIENT GOALS: "to not  feel like I'm crawling out of my skin anymore"   OBJECTIVE:  Note: Objective measures were completed at Evaluation unless otherwise noted.  DIAGNOSTIC FINDINGS:  05/07/23 - MRI Face MR face / trigeminal (with and without) demonstrating: - Small normal appearing vascular structure noted near the left trigeminal nerve cisternal segment. - Remainder of brain parenchyma and brainstem structures are unremarkable.  MRI Lumbar Spine  06/19/2023 FINDINGS:  The lumbar vertebrae demonstrate normal alignment, body height and bone marrow signal characteristics.  The intervertebral discs throughout mild disc signal abnormalities at L3-4 and L4-5 but without significant disc protrusion, herniation or significant canal stenosis.  There are mild degenerative  changes noted in the facet joints as well as ligamentum flavum posteriorly at L3-4 and L4-5 without significant compression. The conus medullaris terminates at the lower border of L1.  Paraspinal soft tissue appear unremarkable.  Visualized portion of the lower thoracic spine also shows minor disc degenerative changes.    IMPRESSION: MRI scan of lumbar spine without contrast showing only minor disc and facet degenerative changes at L3-4 and L4-5 but without significant compression.  COGNITION: Overall cognitive status: Within functional limits for tasks assessed   SENSATION: Light touch: Impaired   COORDINATION: Intact  EDEMA:  None noted   POSTURE: No Significant postural limitations  LOWER EXTREMITY ROM:    WFL   LOWER EXTREMITY MMT:    MMT Right Eval Left Eval  Hip flexion 4 4  Hip extension    Hip abduction 4 4  Hip adduction 4 4  Hip internal rotation    Hip external rotation    Knee flexion 5 5  Knee extension 5 5  Ankle dorsiflexion 5 4  Ankle plantarflexion    Ankle inversion    Ankle eversion    (Blank rows = not tested)  BED MOBILITY:  Independent    STAIRS: Level of Assistance: Complete Independence Stair  Negotiation Technique: Alternating Pattern  with Single Rail on Right Comments: reports difficulty when carrying heavy objects up  GAIT: Gait pattern: WFL Distance walked: clinic distances  Assistive device utilized: None Level of assistance: Complete Independence   FUNCTIONAL TESTS:   5xSTS 10 sec, w/o UE support  920', independent    TODAY'S TREATMENT:                                                                                                                              TherEx  TherAct ***   PATIENT EDUCATION: Education details: PT exam fidnings, POC length *** Person educated: Patient Education method: Explanation Education comprehension: verbalized understanding  HOME EXERCISE PROGRAM: To be provided  GOALS: Goals reviewed with patient? Yes  SHORT TERM GOALS: Target date: same as LTG    LONG TERM GOALS: Target date: 07/19/2023  Patient will be independent with HEP for improved strength and quality of life.  Baseline: to be provided  Goal status: INITIAL  2.  Patient will increase to 1200' for improved community ambulation distances.  Baseline: 920' Goal status: INITIAL  3.  Patient will report independence with carrying groceries up the stairs to her apartment.  Baseline: reported unable  Goal status: INITIAL    ASSESSMENT:  CLINICAL IMPRESSION: Emphasis of skilled PT session*** Continue POC.    OBJECTIVE IMPAIRMENTS: decreased activity tolerance, decreased balance, decreased strength, and impaired sensation.   ACTIVITY LIMITATIONS: carrying, lifting, stairs, and locomotion level  PARTICIPATION LIMITATIONS: community activity and occupation  PERSONAL FACTORS: Age and Time since onset of injury/illness/exacerbation are also affecting patient's functional outcome.   REHAB POTENTIAL: Good  CLINICAL DECISION MAKING: Stable/uncomplicated  EVALUATION COMPLEXITY: Low  PLAN:  PT FREQUENCY: 1-2x/week  PT DURATION: 4  weeks  PLANNED INTERVENTIONS: 97164- PT Re-evaluation, 97110-Therapeutic exercises, 97530- Therapeutic activity, [id number]- Neuromuscular re-education, 97535- Self Care, 28413- Manual therapy, [phone]- Gait training, [phone]- Aquatic Therapy, Patient/Family education, Balance training, Stair training, Spinal manipulation, and Spinal mobilization  PLAN FOR NEXT SESSION: MRI results? Build HEP, LE strength, Lumbar stretch***   Peter Congo, PT, DPT, CSRS   06/24/2023, 7:35 AM

## 2023-07-01 ENCOUNTER — Ambulatory Visit: Payer: BC Managed Care – PPO

## 2023-07-01 ENCOUNTER — Inpatient Hospital Stay (HOSPITAL_BASED_OUTPATIENT_CLINIC_OR_DEPARTMENT_OTHER): Payer: BC Managed Care – PPO | Admitting: Medical Oncology

## 2023-07-01 ENCOUNTER — Other Ambulatory Visit: Payer: Self-pay | Admitting: *Deleted

## 2023-07-01 ENCOUNTER — Encounter: Payer: Self-pay | Admitting: Medical Oncology

## 2023-07-01 ENCOUNTER — Inpatient Hospital Stay: Payer: BC Managed Care – PPO

## 2023-07-01 ENCOUNTER — Inpatient Hospital Stay: Payer: BC Managed Care – PPO | Attending: Hematology & Oncology

## 2023-07-01 VITALS — BP 121/80 | HR 100 | Temp 98.3°F | Resp 18 | Ht 66.0 in | Wt 120.0 lb

## 2023-07-01 DIAGNOSIS — D693 Immune thrombocytopenic purpura: Secondary | ICD-10-CM

## 2023-07-01 DIAGNOSIS — D696 Thrombocytopenia, unspecified: Secondary | ICD-10-CM | POA: Diagnosis not present

## 2023-07-01 DIAGNOSIS — Z79899 Other long term (current) drug therapy: Secondary | ICD-10-CM | POA: Insufficient documentation

## 2023-07-01 DIAGNOSIS — R200 Anesthesia of skin: Secondary | ICD-10-CM

## 2023-07-01 DIAGNOSIS — R0602 Shortness of breath: Secondary | ICD-10-CM | POA: Insufficient documentation

## 2023-07-01 DIAGNOSIS — R002 Palpitations: Secondary | ICD-10-CM | POA: Insufficient documentation

## 2023-07-01 DIAGNOSIS — R202 Paresthesia of skin: Secondary | ICD-10-CM | POA: Diagnosis not present

## 2023-07-01 LAB — CMP (CANCER CENTER ONLY)
ALT: 8 U/L (ref 0–44)
AST: 12 U/L — ABNORMAL LOW (ref 15–41)
Albumin: 4.5 g/dL (ref 3.5–5.0)
Alkaline Phosphatase: 45 U/L (ref 38–126)
Anion gap: 5 (ref 5–15)
BUN: 11 mg/dL (ref 6–20)
CO2: 30 mmol/L (ref 22–32)
Calcium: 9.6 mg/dL (ref 8.9–10.3)
Chloride: 103 mmol/L (ref 98–111)
Creatinine: 0.65 mg/dL (ref 0.44–1.00)
GFR, Estimated: >60 mL/min (ref >60)
Glucose, Bld: 135 mg/dL — ABNORMAL HIGH (ref 70–99)
Potassium: 4.4 mmol/L (ref 3.5–5.1)
Sodium: 138 mmol/L (ref 135–145)
Total Bilirubin: 2.6 mg/dL — ABNORMAL HIGH (ref <1.2)
Total Protein: 6.9 g/dL (ref 6.5–8.1)

## 2023-07-01 LAB — CBC WITH DIFFERENTIAL (CANCER CENTER ONLY)
Abs Immature Granulocytes: 0.02 10*3/uL (ref 0.00–0.07)
Basophils Absolute: 0 10*3/uL (ref 0.0–0.1)
Basophils Relative: 0 %
Eosinophils Absolute: 0 10*3/uL (ref 0.0–0.5)
Eosinophils Relative: 1 %
HCT: 43.3 % (ref 36.0–46.0)
Hemoglobin: 14.4 g/dL (ref 12.0–15.0)
Immature Granulocytes: 0 %
Lymphocytes Relative: 26 %
Lymphs Abs: 1.4 10*3/uL (ref 0.7–4.0)
MCH: 31 pg (ref 26.0–34.0)
MCHC: 33.3 g/dL (ref 30.0–36.0)
MCV: 93.1 fL (ref 80.0–100.0)
Monocytes Absolute: 0.3 10*3/uL (ref 0.1–1.0)
Monocytes Relative: 6 %
Neutro Abs: 3.6 10*3/uL (ref 1.7–7.7)
Neutrophils Relative %: 67 %
Platelet Count: 136 10*3/uL — ABNORMAL LOW (ref 150–400)
RBC: 4.65 MIL/uL (ref 3.87–5.11)
RDW: 12.5 % (ref 11.5–15.5)
WBC Count: 5.4 10*3/uL (ref 4.0–10.5)
nRBC: 0 % (ref 0.0–0.2)

## 2023-07-01 LAB — LACTATE DEHYDROGENASE
LDH: 101 U/L (ref 98–192)
LDH: 99 U/L (ref 98–192)

## 2023-07-01 LAB — TSH: TSH: 1.618 u[IU]/mL (ref 0.350–4.500)

## 2023-07-01 LAB — SAVE SMEAR(SSMR), FOR PROVIDER SLIDE REVIEW

## 2023-07-01 LAB — VITAMIN B12: Vitamin B-12: 194 pg/mL (ref 180–914)

## 2023-07-01 NOTE — Progress Notes (Signed)
Hematology and Oncology Follow Up Visit  Shandra Ozbirn [id number] 02-11-1996 27 y.o. 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 Nevius is here today for follow-up. We last saw her 1 year ago.   She is working with neurology regarding some numbness and tingling. She was started on gabapentin as of 2 weeks ago. Per patient they have not found where her tingling is coming from.   She states that her cycle is regular with heavy flow. No other blood loss noted.  She notes occasional SOB and palpitations with over exertion. She will take a break to rest when needed and symptoms will resolve.  No fever, chills, n/v, cough, rash, dizziness, chest pain, abdominal pain or changes in bowel or bladder habits.  No swelling, tenderness, numbness or tingling in her extremities.  No falls or syncope.  Appetite and hydration are good.   Wt Readings from Last 3 Encounters:  07/01/23 120 lb (54.4 kg)  04/26/23 117 lb (53.1 kg)  07/23/22 120 lb (54.4 kg)   ECOG Performance Status: 0 - Asymptomatic  Medications:  Allergies as of 07/01/2023   No Known Allergies      Medication List        Accurate as of July 01, 2023  1:57 PM. If you have any questions, ask your nurse or doctor.          gabapentin 100 MG capsule Commonly known as: Neurontin Take 1 capsule (100 mg total) by mouth 3 (three) times daily as needed.        Allergies: No Known Allergies  Past Medical History, Surgical history, Social history, and Family History were reviewed and updated.  Review of Systems: All other 10 point review of systems is negative.   Physical Exam:  height is 5\' 6"  (1.676 m) and weight is 120 lb (54.4 kg). Her oral temperature is 98.3 F (36.8 C). Her blood pressure is 121/80 and her pulse is 100. Her respiration is 18 and oxygen saturation is 100%.   Wt Readings  from Last 3 Encounters:  07/01/23 120 lb (54.4 kg)  04/26/23 117 lb (53.1 kg)  07/23/22 120 lb (54.4 kg)    Ocular: Sclerae unicteric, pupils equal, round and reactive to light Ear-nose-throat: Oropharynx clear, dentition fair Lymphatic: No cervical or supraclavicular adenopathy Lungs no rales or rhonchi, good excursion bilaterally Heart regular rate and rhythm, no murmur appreciated Abd soft, nontender, positive bowel sounds MSK no focal spinal tenderness, no joint edema Neuro: non-focal, well-oriented, appropriate affect  Lab Results  Component Value Date   WBC 5.4 07/01/2023   HGB 14.4 07/01/2023   HCT 43.3 07/01/2023   MCV 93.1 07/01/2023   PLT 136 (L) 07/01/2023   No results found for: "FERRITIN", "IRON", "TIBC", "UIBC", "IRONPCTSAT" Lab Results  Component Value Date   RBC 4.65 07/01/2023   No results found for: "KPAFRELGTCHN", "LAMBDASER", "KAPLAMBRATIO" No results found for: "IGGSERUM", "IGA", "IGMSERUM" No results found for: "TOTALPROTELP", "ALBUMINELP", "A1GS", "A2GS", "BETS", "BETA2SER", "GAMS", "MSPIKE", "SPEI"   Chemistry      Component Value Date/Time   NA 138 07/01/2023 1301   K 4.4 07/01/2023 1301   CL 103 07/01/2023 1301   CO2 30 07/01/2023 1301   BUN 11 07/01/2023 1301   CREATININE 0.65 07/01/2023 1301      Component Value Date/Time   CALCIUM 9.6 07/01/2023 1301   ALKPHOS 45 07/01/2023  1301   AST 12 (L) 07/01/2023 1301   ALT 8 07/01/2023 1301   BILITOT 2.6 (H) 07/01/2023 1301     Encounter Diagnoses  Name Primary?   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 Oles is a very pleasant 27 yo caucasian female with history of thrombocytopenia.   Platelets are stable at 136 today- up from 132 last year  Given her history of ITP I will check a TSH as well as a MM study.  We discussed s/s of low platelets such as heavy blood loss, excessive bruising and petechiae. She will contact our  office with any questions or concerns. We can see her sooner if needed.  RTC 1 year APP, labs (CBC w/, CMP, save smear)  Rushie Chestnut, PA-C 12/16/20241:57 PM

## 2023-07-02 ENCOUNTER — Other Ambulatory Visit: Payer: Self-pay | Admitting: Medical Oncology

## 2023-07-02 ENCOUNTER — Encounter: Payer: Self-pay | Admitting: Medical Oncology

## 2023-07-02 DIAGNOSIS — E538 Deficiency of other specified B group vitamins: Secondary | ICD-10-CM | POA: Insufficient documentation

## 2023-07-02 LAB — KAPPA/LAMBDA LIGHT CHAINS
Kappa free light chain: 12.5 mg/L (ref 3.3–19.4)
Kappa, lambda light chain ratio: 1.23 (ref 0.26–1.65)
Lambda free light chains: 10.2 mg/L (ref 5.7–26.3)

## 2023-07-07 ENCOUNTER — Ambulatory Visit (HOSPITAL_BASED_OUTPATIENT_CLINIC_OR_DEPARTMENT_OTHER)
Admission: RE | Admit: 2023-07-07 | Discharge: 2023-07-07 | Disposition: A | Discharge status: Home / Self Care | Payer: BC Managed Care – PPO | Source: Ambulatory Visit | Attending: Medical Oncology | Admitting: Medical Oncology

## 2023-07-07 DIAGNOSIS — R202 Paresthesia of skin: Secondary | ICD-10-CM | POA: Insufficient documentation

## 2023-07-07 DIAGNOSIS — D693 Immune thrombocytopenic purpura: Secondary | ICD-10-CM

## 2023-07-07 DIAGNOSIS — R200 Anesthesia of skin: Secondary | ICD-10-CM | POA: Diagnosis not present

## 2023-07-08 ENCOUNTER — Ambulatory Visit: Payer: BC Managed Care – PPO | Admitting: Physical Therapy

## 2023-07-09 DIAGNOSIS — F324 Major depressive disorder, single episode, in partial remission: Secondary | ICD-10-CM | POA: Insufficient documentation

## 2023-07-09 DIAGNOSIS — K219 Gastro-esophageal reflux disease without esophagitis: Secondary | ICD-10-CM | POA: Insufficient documentation

## 2023-07-09 DIAGNOSIS — F419 Anxiety disorder, unspecified: Secondary | ICD-10-CM | POA: Insufficient documentation

## 2023-07-09 DIAGNOSIS — F3281 Premenstrual dysphoric disorder: Secondary | ICD-10-CM | POA: Insufficient documentation

## 2023-07-09 DIAGNOSIS — E559 Vitamin D deficiency, unspecified: Secondary | ICD-10-CM | POA: Insufficient documentation

## 2023-07-09 DIAGNOSIS — F909 Attention-deficit hyperactivity disorder, unspecified type: Secondary | ICD-10-CM | POA: Insufficient documentation

## 2023-07-09 DIAGNOSIS — F418 Other specified anxiety disorders: Secondary | ICD-10-CM | POA: Insufficient documentation

## 2023-07-09 DIAGNOSIS — E040 Nontoxic diffuse goiter: Secondary | ICD-10-CM | POA: Insufficient documentation

## 2023-07-09 DIAGNOSIS — G4700 Insomnia, unspecified: Secondary | ICD-10-CM | POA: Insufficient documentation

## 2023-07-10 LAB — MULTIPLE MYELOMA PANEL, SERUM
Albumin SerPl Elph-Mcnc: 4 g/dL (ref 2.9–4.4)
Albumin/Glob SerPl: 1.6 (ref 0.7–1.7)
Alpha 1: 0.2 g/dL (ref 0.0–0.4)
Alpha2 Glob SerPl Elph-Mcnc: 0.5 g/dL (ref 0.4–1.0)
B-Globulin SerPl Elph-Mcnc: 1 g/dL (ref 0.7–1.3)
Gamma Glob SerPl Elph-Mcnc: 0.9 g/dL (ref 0.4–1.8)
Globulin, Total: 2.6 g/dL (ref 2.2–3.9)
IgA: 251 mg/dL (ref 87–352)
IgG (Immunoglobin G), Serum: 878 mg/dL (ref 586–1602)
IgM (Immunoglobulin M), Srm: 108 mg/dL (ref 26–217)
Total Protein ELP: 6.6 g/dL (ref 6.0–8.5)

## 2023-07-11 ENCOUNTER — Encounter: Payer: BC Managed Care – PPO | Admitting: Family Medicine

## 2023-07-11 DIAGNOSIS — Z0000 Encounter for general adult medical examination without abnormal findings: Secondary | ICD-10-CM

## 2023-07-22 ENCOUNTER — Encounter: Payer: Self-pay | Admitting: Hematology & Oncology

## 2023-07-22 ENCOUNTER — Ambulatory Visit: Payer: BC Managed Care – PPO | Attending: Neurology

## 2023-07-22 NOTE — Therapy (Signed)
 [facility] [address] [place], [place], [address] Phone: [phone]   Fax:  [phone]  Patient Details  Name: Nancy Wallace MRN: [id number] Date of Birth: 1996/02/05 Referring Provider:  No ref. provider found  Encounter Date: 07/22/2023  PHYSICAL THERAPY DISCHARGE SUMMARY  Visits from Start of Care: 1  Current functional level related to goals / functional outcomes: Unable to be assessed as patient has not returned since eval   Remaining deficits: Unable to be assessed as patient has not returned since eval   Education / Equipment: PT POC   Patient agrees to discharge. Patient goals were  Unable to be assessed as patient has not returned since eval . Patient is being discharged due to not returning since the last visit.  Delon DELENA Pop, PT Delon DELENA Pop, PT, DPT, CBIS  07/22/2023, 9:12 AM   [facility] [address] [place], [place], [address] Phone: [phone]   Fax:  [phone]

## 2023-07-23 ENCOUNTER — Inpatient Hospital Stay: Payer: BC Managed Care – PPO | Attending: Hematology & Oncology

## 2023-07-23 VITALS — BP 132/80 | HR 82 | Temp 98.0°F | Resp 16

## 2023-07-23 DIAGNOSIS — Z79899 Other long term (current) drug therapy: Secondary | ICD-10-CM | POA: Insufficient documentation

## 2023-07-23 DIAGNOSIS — E538 Deficiency of other specified B group vitamins: Secondary | ICD-10-CM | POA: Insufficient documentation

## 2023-07-23 DIAGNOSIS — D696 Thrombocytopenia, unspecified: Secondary | ICD-10-CM | POA: Diagnosis not present

## 2023-07-23 MED ORDER — CYANOCOBALAMIN 1000 MCG/ML IJ SOLN
1000.0000 ug | Freq: Once | INTRAMUSCULAR | Status: AC
  Administered 2023-07-23: 1000 ug via INTRAMUSCULAR
  Filled 2023-07-23: qty 1

## 2023-07-23 NOTE — Patient Instructions (Signed)
 Vitamin B12 Deficiency Vitamin B12 deficiency occurs when the body does not have enough of this important vitamin. The body needs this vitamin: To make red blood cells. To make DNA. This is the genetic material inside cells. To help the nerves work properly so they can carry messages from the brain to the body. Vitamin B12 deficiency can cause health problems, such as not having enough red blood cells in the blood (anemia). This can lead to nerve damage if untreated. What are the causes? This condition may be caused by: Not eating enough foods that contain vitamin B12. Not having enough stomach acid and digestive fluids to properly absorb vitamin B12 from the food that you eat. Having certain diseases that make it hard to absorb vitamin B12. These diseases include Crohn's disease, chronic pancreatitis, and cystic fibrosis. An autoimmune disorder in which the body does not make enough of a protein (intrinsic factor) within the stomach, resulting in not enough absorption of vitamin B12. Having a surgery in which part of the stomach or small intestine is removed. Taking certain medicines that make it hard for the body to absorb vitamin B12. These include: Heartburn medicines, such as antacids and proton pump inhibitors. Some medicines that are used to treat diabetes. What increases the risk? The following factors may make you more likely to develop a vitamin B12 deficiency: Being an older adult. Eating a vegetarian or vegan diet that does not include any foods that come from animals. Eating a poor diet while you are pregnant. Taking certain medicines. Having alcoholism. What are the signs or symptoms? In some cases, there are no symptoms of this condition. If the condition leads to anemia or nerve damage, various symptoms may occur, such as: Weakness. Tiredness (fatigue). Loss of appetite. Numbness or tingling in your hands and feet. Redness and burning of the tongue. Depression,  confusion, or memory problems. Trouble walking. If anemia is severe, symptoms can include: Shortness of breath. Dizziness. Rapid heart rate. How is this diagnosed? This condition may be diagnosed with a blood test to measure the level of vitamin B12 in your blood. You may also have other tests, including: A group of tests that measure certain characteristics of blood cells (complete blood count, CBC). A blood test to measure intrinsic factor. A procedure where a thin tube with a camera on the end is used to look into your stomach or intestines (endoscopy). Other tests may be needed to discover the cause of the deficiency. How is this treated? Treatment for this condition depends on the cause. This condition may be treated by: Changing your eating and drinking habits, such as: Eating more foods that contain vitamin B12. Drinking less alcohol or no alcohol. Getting vitamin B12 injections. Taking vitamin B12 supplements by mouth (orally). Your health care provider will tell you which dose is best for you. Follow these instructions at home: Eating and drinking  Include foods in your diet that come from animals and contain a lot of vitamin B12. These include: Meats and poultry. This includes beef, pork, chicken, [place], and organ meats, such as liver. Seafood. This includes clams, rainbow trout, salmon, tuna, and haddock. Eggs. Dairy foods such as milk, yogurt, and cheese. Eat foods that have vitamin B12 added to them (are fortified), such as ready-to-eat breakfast cereals. Check the label on the package to see if a food is fortified. The items listed above may not be a complete list of foods and beverages you can eat and drink. Contact a dietitian for  more information. Alcohol use Do not drink alcohol if: Your health care provider tells you not to drink. You are pregnant, may be pregnant, or are planning to become pregnant. If you drink alcohol: Limit how much you have to: 0-1 drink a  day for women. 0-2 drinks a day for men. Know how much alcohol is in your drink. In the [place] one drink equals one 12 oz bottle of beer (355 mL), one 5 oz glass of wine (148 mL), or one 1 oz glass of hard liquor (44 mL). General instructions Get vitamin B12 injections if told to by your health care provider. Take supplements only as told by your health care provider. Follow the directions carefully. Keep all follow-up visits. This is important. Contact a health care provider if: Your symptoms come back. Your symptoms get worse or do not improve with treatment. Get help right away: You develop shortness of breath. You have a rapid heart rate. You have chest pain. You become dizzy or you faint. These symptoms may be an emergency. Get help right away. Call 911. Do not wait to see if the symptoms will go away. Do not drive yourself to the hospital. Summary Vitamin B12 deficiency occurs when the body does not have enough of this important vitamin. Common causes include not eating enough foods that contain vitamin B12, not being able to absorb vitamin B12 from the food that you eat, having a surgery in which part of the stomach or small intestine is removed, or taking certain medicines. Eat foods that have vitamin B12 in them. Treatment may include making a change in the way you eat and drink, getting vitamin B12 injections, or taking vitamin B12 supplements. This information is not intended to replace advice given to you by your health care provider. Make sure you discuss any questions you have with your health care provider. Document Revised: 02/24/2021 Document Reviewed: 02/24/2021 Elsevier Patient Education  2024 [employer].

## 2023-07-30 ENCOUNTER — Inpatient Hospital Stay: Payer: BC Managed Care – PPO

## 2023-07-30 VITALS — BP 116/80 | HR 70 | Temp 97.8°F | Resp 20

## 2023-07-30 DIAGNOSIS — Z79899 Other long term (current) drug therapy: Secondary | ICD-10-CM | POA: Diagnosis not present

## 2023-07-30 DIAGNOSIS — E538 Deficiency of other specified B group vitamins: Secondary | ICD-10-CM | POA: Diagnosis not present

## 2023-07-30 DIAGNOSIS — D696 Thrombocytopenia, unspecified: Secondary | ICD-10-CM | POA: Diagnosis not present

## 2023-07-30 MED ORDER — CYANOCOBALAMIN 1000 MCG/ML IJ SOLN
1000.0000 ug | Freq: Once | INTRAMUSCULAR | Status: AC
  Administered 2023-07-30: 1000 ug via INTRAMUSCULAR
  Filled 2023-07-30: qty 1

## 2023-07-30 NOTE — Patient Instructions (Signed)
 Vitamin B12 Injection What is this medication? Vitamin B12 (VAHY tuh min B12) prevents and treats low vitamin B12 levels in your body. It is used in people who do not get enough vitamin B12 from their diet or when their digestive tract does not absorb enough. Vitamin B12 plays an important role in maintaining the health of your nervous system and red blood cells. This medicine may be used for other purposes; ask your health care provider or pharmacist if you have questions. COMMON BRAND NAME(S): B-12 Compliance Kit, B-12 Injection Kit, Cyomin, Dodex , LA-12, Nutri-Twelve, Physicians EZ Use B-12, Primabalt, Vitamin Deficiency Injectable System - B12 What should I tell my care team before I take this medication? They need to know if you have any of these conditions: Kidney disease Leber's disease Megaloblastic anemia An unusual or allergic reaction to cyanocobalamin , cobalt, other medications, foods, dyes, or preservatives Pregnant or trying to get pregnant Breast-feeding How should I use this medication? This medication is injected into a muscle or deeply under the skin. It is usually given in a clinic or care team's office. However, your care team may teach you how to inject yourself. Follow all instructions. Talk to your care team about the use of this medication in children. Special care may be needed. Overdosage: If you think you have taken too much of this medicine contact a poison control center or emergency room at once. NOTE: This medicine is only for you. Do not share this medicine with others. What if I miss a dose? If you are given your dose at a clinic or care team's office, call to reschedule your appointment. If you give your own injections, and you miss a dose, take it as soon as you can. If it is almost time for your next dose, take only that dose. Do not take double or extra doses. What may interact with this medication? Alcohol Colchicine This list may not describe all possible  interactions. Give your health care provider a list of all the medicines, herbs, non-prescription drugs, or dietary supplements you use. Also tell them if you smoke, drink alcohol, or use illegal drugs. Some items may interact with your medicine. What should I watch for while using this medication? Visit your care team regularly. You may need blood work done while you are taking this medication. You may need to follow a special diet. Talk to your care team. Limit your alcohol intake and avoid smoking to get the best benefit. What side effects may I notice from receiving this medication? Side effects that you should report to your care team as soon as possible: Allergic reactions--skin rash, itching, hives, swelling of the face, lips, tongue, or throat Swelling of the ankles, hands, or feet Trouble breathing Side effects that usually do not require medical attention (report to your care team if they continue or are bothersome): Diarrhea This list may not describe all possible side effects. Call your doctor for medical advice about side effects. You may report side effects to FDA at 1-800-FDA-1088. Where should I keep my medication? Keep out of the reach of children. Store at room temperature between 15 and 30 degrees C (59 and 85 degrees F). Protect from light. Throw away any unused medication after the expiration date. NOTE: This sheet is a summary. It may not cover all possible information. If you have questions about this medicine, talk to your doctor, pharmacist, or health care provider.  2024 Elsevier/Gold Standard (2021-03-14 00:00:00)

## 2023-08-06 ENCOUNTER — Inpatient Hospital Stay: Payer: BC Managed Care – PPO

## 2023-08-06 VITALS — BP 121/80 | HR 84 | Temp 98.0°F

## 2023-08-06 DIAGNOSIS — E538 Deficiency of other specified B group vitamins: Secondary | ICD-10-CM | POA: Diagnosis not present

## 2023-08-06 DIAGNOSIS — D696 Thrombocytopenia, unspecified: Secondary | ICD-10-CM | POA: Diagnosis not present

## 2023-08-06 DIAGNOSIS — Z79899 Other long term (current) drug therapy: Secondary | ICD-10-CM | POA: Diagnosis not present

## 2023-08-06 MED ORDER — CYANOCOBALAMIN 1000 MCG/ML IJ SOLN
1000.0000 ug | Freq: Once | INTRAMUSCULAR | Status: AC
Start: 2023-08-06 — End: 2023-08-06
  Administered 2023-08-06: 1000 ug via INTRAMUSCULAR
  Filled 2023-08-06: qty 1

## 2023-08-06 NOTE — Patient Instructions (Signed)
Vitamin B12 Deficiency Vitamin B12 deficiency occurs when the body does not have enough of this important vitamin. The body needs this vitamin: To make red blood cells. To make DNA. This is the genetic material inside cells. To help the nerves work properly so they can carry messages from the brain to the body. Vitamin B12 deficiency can cause health problems, such as not having enough red blood cells in the blood (anemia). This can lead to nerve damage if untreated. What are the causes? This condition may be caused by: Not eating enough foods that contain vitamin B12. Not having enough stomach acid and digestive fluids to properly absorb vitamin B12 from the food that you eat. Having certain diseases that make it hard to absorb vitamin B12. These diseases include Crohn's disease, chronic pancreatitis, and cystic fibrosis. An autoimmune disorder in which the body does not make enough of a protein (intrinsic factor) within the stomach, resulting in not enough absorption of vitamin B12. Having a surgery in which part of the stomach or small intestine is removed. Taking certain medicines that make it hard for the body to absorb vitamin B12. These include: Heartburn medicines, such as antacids and proton pump inhibitors. Some medicines that are used to treat diabetes. What increases the risk? The following factors may make you more likely to develop a vitamin B12 deficiency: Being an older adult. Eating a vegetarian or vegan diet that does not include any foods that come from animals. Eating a poor diet while you are pregnant. Taking certain medicines. Having alcoholism. What are the signs or symptoms? In some cases, there are no symptoms of this condition. If the condition leads to anemia or nerve damage, various symptoms may occur, such as: Weakness. Tiredness (fatigue). Loss of appetite. Numbness or tingling in your hands and feet. Redness and burning of the tongue. Depression,  confusion, or memory problems. Trouble walking. If anemia is severe, symptoms can include: Shortness of breath. Dizziness. Rapid heart rate. How is this diagnosed? This condition may be diagnosed with a blood test to measure the level of vitamin B12 in your blood. You may also have other tests, including: A group of tests that measure certain characteristics of blood cells (complete blood count, CBC). A blood test to measure intrinsic factor. A procedure where a thin tube with a camera on the end is used to look into your stomach or intestines (endoscopy). Other tests may be needed to discover the cause of the deficiency. How is this treated? Treatment for this condition depends on the cause. This condition may be treated by: Changing your eating and drinking habits, such as: Eating more foods that contain vitamin B12. Drinking less alcohol or no alcohol. Getting vitamin B12 injections. Taking vitamin B12 supplements by mouth (orally). Your health care provider will tell you which dose is best for you. Follow these instructions at home: Eating and drinking  Include foods in your diet that come from animals and contain a lot of vitamin B12. These include: Meats and poultry. This includes beef, pork, chicken, turkey, and organ meats, such as liver. Seafood. This includes clams, rainbow trout, salmon, tuna, and haddock. Eggs. Dairy foods such as milk, yogurt, and cheese. Eat foods that have vitamin B12 added to them (are fortified), such as ready-to-eat breakfast cereals. Check the label on the package to see if a food is fortified. The items listed above may not be a complete list of foods and beverages you can eat and drink. Contact a dietitian for   more information. Alcohol use Do not drink alcohol if: Your health care provider tells you not to drink. You are pregnant, may be pregnant, or are planning to become pregnant. If you drink alcohol: Limit how much you have to: 0-1 drink a  day for women. 0-2 drinks a day for men. Know how much alcohol is in your drink. In the [place] one drink equals one 12 oz bottle of beer (355 mL), one 5 oz glass of wine (148 mL), or one 1 oz glass of hard liquor (44 mL). General instructions Get vitamin B12 injections if told to by your health care provider. Take supplements only as told by your health care provider. Follow the directions carefully. Keep all follow-up visits. This is important. Contact a health care provider if: Your symptoms come back. Your symptoms get worse or do not improve with treatment. Get help right away: You develop shortness of breath. You have a rapid heart rate. You have chest pain. You become dizzy or you faint. These symptoms may be an emergency. Get help right away. Call 911. Do not wait to see if the symptoms will go away. Do not drive yourself to the hospital. Summary Vitamin B12 deficiency occurs when the body does not have enough of this important vitamin. Common causes include not eating enough foods that contain vitamin B12, not being able to absorb vitamin B12 from the food that you eat, having a surgery in which part of the stomach or small intestine is removed, or taking certain medicines. Eat foods that have vitamin B12 in them. Treatment may include making a change in the way you eat and drink, getting vitamin B12 injections, or taking vitamin B12 supplements. This information is not intended to replace advice given to you by your health care provider. Make sure you discuss any questions you have with your health care provider. Document Revised: 02/24/2021 Document Reviewed: 02/24/2021 Elsevier Patient Education  2024 Else[employer] Inc.  

## 2023-08-13 ENCOUNTER — Inpatient Hospital Stay: Payer: BC Managed Care – PPO

## 2023-08-13 VITALS — BP 128/93 | HR 81 | Temp 97.9°F | Resp 16

## 2023-08-13 DIAGNOSIS — E538 Deficiency of other specified B group vitamins: Secondary | ICD-10-CM | POA: Diagnosis not present

## 2023-08-13 DIAGNOSIS — Z79899 Other long term (current) drug therapy: Secondary | ICD-10-CM | POA: Diagnosis not present

## 2023-08-13 DIAGNOSIS — D696 Thrombocytopenia, unspecified: Secondary | ICD-10-CM | POA: Diagnosis not present

## 2023-08-13 MED ORDER — CYANOCOBALAMIN 1000 MCG/ML IJ SOLN
1000.0000 ug | Freq: Once | INTRAMUSCULAR | Status: AC
  Administered 2023-08-13: 1000 ug via INTRAMUSCULAR
  Filled 2023-08-13: qty 1

## 2023-08-13 NOTE — Patient Instructions (Signed)
Vitamin B12 Deficiency Vitamin B12 deficiency means that your body does not have enough vitamin B12. The body needs this important vitamin: To make red blood cells. To make genes (DNA). To help the nerves work. If you do not have enough vitamin B12 in your body, you can have health problems, such as not having enough red blood cells in the blood (anemia). What are the causes? Not eating enough foods that contain vitamin B12. Not being able to take in (absorb) vitamin B12 from the food that you eat. Certain diseases. A condition in which the body does not make enough of a certain protein. This results in your body not taking in enough vitamin B12. Having a surgery in which part of the stomach or small intestine is taken out. Taking medicines that make it hard for the body to take in vitamin B12. These include: Heartburn medicines. Some medicines that are used to treat diabetes. What increases the risk? Being an older adult. Eating a vegetarian or vegan diet that does not include any foods that come from animals. Not eating enough foods that contain vitamin B12 while you are pregnant. Taking certain medicines. Having alcoholism. What are the signs or symptoms? In some cases, there are no symptoms. If the condition leads to too few blood cells or nerve damage, symptoms can occur, such as: Feeling weak or tired. Not being hungry. Losing feeling (numbness) or tingling in your hands and feet. Redness and burning of the tongue. Feeling sad (depressed). Confusion or memory problems. Trouble walking. If anemia is very bad, symptoms can include: Being short of breath. Being dizzy. Having a very fast heartbeat. How is this treated? Changing the way you eat and drink, such as: Eating more foods that contain vitamin B12. Drinking little or no alcohol. Getting vitamin B12 shots. Taking vitamin B12 supplements by mouth (orally). Your doctor will tell you the dose that is best for you. Follow  these instructions at home: Eating and drinking  Eat foods that come from animals and have a lot of vitamin B12 in them. These include: Meats and poultry. This includes beef, pork, chicken, [place], and organ meats, such as liver. Seafood, such as clams, rainbow trout, salmon, tuna, and haddock. Eggs. Dairy foods such as milk, yogurt, and cheese. Eat breakfast cereals that have vitamin B12 added to them (are fortified). Check the label. The items listed above may not be a complete list of foods and beverages you can eat and drink. Contact a dietitian for more information. Alcohol use Do not drink alcohol if: Your doctor tells you not to drink. You are pregnant, may be pregnant, or are planning to become pregnant. If you drink alcohol: Limit how much you have to: 0-1 drink a day for women. 0-2 drinks a day for men. Know how much alcohol is in your drink. In the [place] one drink equals one 12 oz bottle of beer (355 mL), one 5 oz glass of wine (148 mL), or one 1 oz glass of hard liquor (44 mL). General instructions Get any vitamin B12 shots if told by your doctor. Take supplements only as told by your doctor. Follow the directions. Keep all follow-up visits. Contact a doctor if: Your symptoms come back. Your symptoms get worse or do not get better with treatment. Get help right away if: You have trouble breathing. You have a very fast heartbeat. You have chest pain. You get dizzy. You faint. These symptoms may be an emergency. Get help right away. Call 911.  Do not wait to see if the symptoms will go away. Do not drive yourself to the hospital. Summary Vitamin B12 deficiency means that your body is not getting enough of the vitamin. In some cases, there are no symptoms of this condition. Treatment may include making a change in the way you eat and drink, getting shots, or taking supplements. Eat foods that have vitamin B12 in them. This information is not intended to replace advice  given to you by your health care provider. Make sure you discuss any questions you have with your health care provider. Document Revised: 02/24/2021 Document Reviewed: 02/24/2021 Elsevier Patient Education  2024 [employer].

## 2023-09-10 ENCOUNTER — Inpatient Hospital Stay: Payer: BC Managed Care – PPO | Attending: Hematology & Oncology

## 2023-09-10 VITALS — BP 105/80 | HR 89 | Temp 98.4°F | Resp 18

## 2023-09-10 DIAGNOSIS — E538 Deficiency of other specified B group vitamins: Secondary | ICD-10-CM | POA: Diagnosis not present

## 2023-09-10 DIAGNOSIS — Z79899 Other long term (current) drug therapy: Secondary | ICD-10-CM | POA: Diagnosis not present

## 2023-09-10 DIAGNOSIS — D696 Thrombocytopenia, unspecified: Secondary | ICD-10-CM | POA: Diagnosis not present

## 2023-09-10 MED ORDER — CYANOCOBALAMIN 1000 MCG/ML IJ SOLN
1000.0000 ug | Freq: Once | INTRAMUSCULAR | Status: AC
  Administered 2023-09-10: 1000 ug via INTRAMUSCULAR
  Filled 2023-09-10: qty 1

## 2023-09-10 NOTE — Patient Instructions (Signed)
 Vitamin B12 Deficiency Vitamin B12 deficiency means that your body does not have enough vitamin B12. The body needs this important vitamin: To make red blood cells. To make genes (DNA). To help the nerves work. If you do not have enough vitamin B12 in your body, you can have health problems, such as not having enough red blood cells in the blood (anemia). What are the causes? Not eating enough foods that contain vitamin B12. Not being able to take in (absorb) vitamin B12 from the food that you eat. Certain diseases. A condition in which the body does not make enough of a certain protein. This results in your body not taking in enough vitamin B12. Having a surgery in which part of the stomach or small intestine is taken out. Taking medicines that make it hard for the body to take in vitamin B12. These include: Heartburn medicines. Some medicines that are used to treat diabetes. What increases the risk? Being an older adult. Eating a vegetarian or vegan diet that does not include any foods that come from animals. Not eating enough foods that contain vitamin B12 while you are pregnant. Taking certain medicines. Having alcoholism. What are the signs or symptoms? In some cases, there are no symptoms. If the condition leads to too few blood cells or nerve damage, symptoms can occur, such as: Feeling weak or tired. Not being hungry. Losing feeling (numbness) or tingling in your hands and feet. Redness and burning of the tongue. Feeling sad (depressed). Confusion or memory problems. Trouble walking. If anemia is very bad, symptoms can include: Being short of breath. Being dizzy. Having a very fast heartbeat. How is this treated? Changing the way you eat and drink, such as: Eating more foods that contain vitamin B12. Drinking little or no alcohol. Getting vitamin B12 shots. Taking vitamin B12 supplements by mouth (orally). Your doctor will tell you the dose that is best for you. Follow  these instructions at home: Eating and drinking  Eat foods that come from animals and have a lot of vitamin B12 in them. These include: Meats and poultry. This includes beef, pork, chicken, [place], and organ meats, such as liver. Seafood, such as clams, rainbow trout, salmon, tuna, and haddock. Eggs. Dairy foods such as milk, yogurt, and cheese. Eat breakfast cereals that have vitamin B12 added to them (are fortified). Check the label. The items listed above may not be a complete list of foods and beverages you can eat and drink. Contact a dietitian for more information. Alcohol use Do not drink alcohol if: Your doctor tells you not to drink. You are pregnant, may be pregnant, or are planning to become pregnant. If you drink alcohol: Limit how much you have to: 0-1 drink a day for women. 0-2 drinks a day for men. Know how much alcohol is in your drink. In the [place] one drink equals one 12 oz bottle of beer (355 mL), one 5 oz glass of wine (148 mL), or one 1 oz glass of hard liquor (44 mL). General instructions Get any vitamin B12 shots if told by your doctor. Take supplements only as told by your doctor. Follow the directions. Keep all follow-up visits. Contact a doctor if: Your symptoms come back. Your symptoms get worse or do not get better with treatment. Get help right away if: You have trouble breathing. You have a very fast heartbeat. You have chest pain. You get dizzy. You faint. These symptoms may be an emergency. Get help right away. Call 911.  Do not wait to see if the symptoms will go away. Do not drive yourself to the hospital. Summary Vitamin B12 deficiency means that your body is not getting enough of the vitamin. In some cases, there are no symptoms of this condition. Treatment may include making a change in the way you eat and drink, getting shots, or taking supplements. Eat foods that have vitamin B12 in them. This information is not intended to replace advice  given to you by your health care provider. Make sure you discuss any questions you have with your health care provider. Document Revised: 02/24/2021 Document Reviewed: 02/24/2021 Elsevier Patient Education  2024 [employer].

## 2023-10-08 ENCOUNTER — Inpatient Hospital Stay: Payer: BC Managed Care – PPO | Attending: Hematology & Oncology

## 2023-10-08 ENCOUNTER — Inpatient Hospital Stay: Payer: BC Managed Care – PPO

## 2023-10-08 ENCOUNTER — Other Ambulatory Visit: Payer: Self-pay | Admitting: Medical Oncology

## 2023-10-08 ENCOUNTER — Inpatient Hospital Stay (HOSPITAL_BASED_OUTPATIENT_CLINIC_OR_DEPARTMENT_OTHER): Payer: BC Managed Care – PPO | Admitting: Medical Oncology

## 2023-10-08 ENCOUNTER — Encounter: Payer: Self-pay | Admitting: Medical Oncology

## 2023-10-08 ENCOUNTER — Other Ambulatory Visit: Payer: Self-pay

## 2023-10-08 VITALS — BP 121/75 | HR 89 | Temp 97.7°F | Resp 17 | Ht 66.0 in | Wt 121.0 lb

## 2023-10-08 DIAGNOSIS — Z79899 Other long term (current) drug therapy: Secondary | ICD-10-CM | POA: Insufficient documentation

## 2023-10-08 DIAGNOSIS — E538 Deficiency of other specified B group vitamins: Secondary | ICD-10-CM | POA: Insufficient documentation

## 2023-10-08 DIAGNOSIS — R202 Paresthesia of skin: Secondary | ICD-10-CM | POA: Diagnosis not present

## 2023-10-08 DIAGNOSIS — D696 Thrombocytopenia, unspecified: Secondary | ICD-10-CM | POA: Diagnosis not present

## 2023-10-08 DIAGNOSIS — D693 Immune thrombocytopenic purpura: Secondary | ICD-10-CM

## 2023-10-08 DIAGNOSIS — R200 Anesthesia of skin: Secondary | ICD-10-CM | POA: Diagnosis not present

## 2023-10-08 LAB — CBC WITH DIFFERENTIAL (CANCER CENTER ONLY)
Abs Immature Granulocytes: 0.02 10*3/uL (ref 0.00–0.07)
Basophils Absolute: 0 10*3/uL (ref 0.0–0.1)
Basophils Relative: 1 %
Eosinophils Absolute: 0.1 10*3/uL (ref 0.0–0.5)
Eosinophils Relative: 2 %
HCT: 41.2 % (ref 36.0–46.0)
Hemoglobin: 13.8 g/dL (ref 12.0–15.0)
Immature Granulocytes: 1 %
Lymphocytes Relative: 25 %
Lymphs Abs: 1 10*3/uL (ref 0.7–4.0)
MCH: 31.9 pg (ref 26.0–34.0)
MCHC: 33.5 g/dL (ref 30.0–36.0)
MCV: 95.2 fL (ref 80.0–100.0)
Monocytes Absolute: 0.3 10*3/uL (ref 0.1–1.0)
Monocytes Relative: 7 %
Neutro Abs: 2.7 10*3/uL (ref 1.7–7.7)
Neutrophils Relative %: 64 %
Platelet Count: 127 10*3/uL — ABNORMAL LOW (ref 150–400)
RBC: 4.33 MIL/uL (ref 3.87–5.11)
RDW: 12.4 % (ref 11.5–15.5)
WBC Count: 4.1 10*3/uL (ref 4.0–10.5)
nRBC: 0 % (ref 0.0–0.2)

## 2023-10-08 LAB — CMP (CANCER CENTER ONLY)
ALT: 7 U/L (ref 0–44)
AST: 13 U/L — ABNORMAL LOW (ref 15–41)
Albumin: 4.6 g/dL (ref 3.5–5.0)
Alkaline Phosphatase: 47 U/L (ref 38–126)
Anion gap: 7 (ref 5–15)
BUN: 15 mg/dL (ref 6–20)
CO2: 30 mmol/L (ref 22–32)
Calcium: 9.1 mg/dL (ref 8.9–10.3)
Chloride: 105 mmol/L (ref 98–111)
Creatinine: 0.85 mg/dL (ref 0.44–1.00)
GFR, Estimated: >60 mL/min (ref >60)
Glucose, Bld: 94 mg/dL (ref 70–99)
Potassium: 4.1 mmol/L (ref 3.5–5.1)
Sodium: 142 mmol/L (ref 135–145)
Total Bilirubin: 1.5 mg/dL — ABNORMAL HIGH (ref 0.0–1.2)
Total Protein: 6.7 g/dL (ref 6.5–8.1)

## 2023-10-08 LAB — VITAMIN B12: Vitamin B-12: 270 pg/mL (ref 180–914)

## 2023-10-08 LAB — FERRITIN: Ferritin: 8 ng/mL — ABNORMAL LOW (ref 11–307)

## 2023-10-08 LAB — IRON AND IRON BINDING CAPACITY (CC-WL,HP ONLY)
Iron: 99 ug/dL (ref 28–170)
Saturation Ratios: 24 % (ref 10.4–31.8)
TIBC: 416 ug/dL (ref 250–450)
UIBC: 317 ug/dL (ref 148–442)

## 2023-10-08 LAB — RETIC PANEL
Immature Retic Fract: 6 % (ref 2.3–15.9)
RBC.: 4.33 MIL/uL (ref 3.87–5.11)
Retic Count, Absolute: 70.6 10*3/uL (ref 19.0–186.0)
Retic Ct Pct: 1.6 % (ref 0.4–3.1)
Reticulocyte Hemoglobin: 32.7 pg (ref >27.9)

## 2023-10-08 MED ORDER — CYANOCOBALAMIN 1000 MCG/ML IJ SOLN
1000.0000 ug | Freq: Once | INTRAMUSCULAR | Status: AC
  Administered 2023-10-08: 1000 ug via INTRAMUSCULAR
  Filled 2023-10-08: qty 1

## 2023-10-08 NOTE — Progress Notes (Signed)
 Hematology and Oncology Follow Up Visit  Nancy Wallace [id number] 09/29/1995 28 y.o. 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 We last saw Nancy Wallace on 07/01/2023.   At this visit she was found to have B12 deficiency with subsequent numbness and tingling. We started Nancy Wallace on B12 injections and Nancy Wallace symptoms have resolved.   She states that Nancy Wallace cycle is regular with heavy flow. No other blood loss noted.  No fever, chills, n/v, cough, rash, dizziness, chest pain, abdominal pain or changes in bowel or bladder habits.  No swelling, tenderness, numbness or tingling in Nancy Wallace extremities.  No falls or syncope.  Appetite and hydration are good.   Wt Readings from Last 3 Encounters:  10/08/23 121 lb (54.9 kg)  07/01/23 120 lb (54.4 kg)  04/26/23 117 lb (53.1 kg)   ECOG Performance Status: 0 - Asymptomatic  Medications:  Allergies as of 10/08/2023   No Known Allergies      Medication List        Accurate as of October 08, 2023 10:33 AM. If you have any questions, ask your nurse or doctor.          gabapentin 100 MG capsule Commonly known as: Neurontin Take 1 capsule (100 mg total) by mouth 3 (three) times daily as needed.        Allergies: No Known Allergies  Past Medical History, Surgical history, Social history, and Family History were reviewed and updated.  Review of Systems: All other 10 point review of systems is negative.   Physical Exam:  height is 5\' 6"  (1.676 m) and weight is 121 lb (54.9 kg). Nancy Wallace oral temperature is 97.7 F (36.5 C). Nancy Wallace blood pressure is 121/75 and Nancy Wallace pulse is 89. Nancy Wallace respiration is 17 and oxygen saturation is 100%.   Wt Readings from Last 3 Encounters:  10/08/23 121 lb (54.9 kg)  07/01/23 120 lb (54.4 kg)  04/26/23 117 lb (53.1 kg)    Ocular: Sclerae  unicteric, pupils equal, round and reactive to light Ear-nose-throat: Oropharynx clear, dentition fair Lymphatic: No cervical or supraclavicular adenopathy Lungs no rales or rhonchi, good excursion bilaterally Heart regular rate and rhythm, no murmur appreciated Abd soft, nontender, positive bowel sounds MSK no focal spinal tenderness, no joint edema Neuro: non-focal, well-oriented, appropriate affect  Lab Results  Component Value Date   WBC 4.1 10/08/2023   HGB 13.8 10/08/2023   HCT 41.2 10/08/2023   MCV 95.2 10/08/2023   PLT 127 (L) 10/08/2023   No results found for: "FERRITIN", "IRON", "TIBC", "UIBC", "IRONPCTSAT" Lab Results  Component Value Date   RETICCTPCT 1.6 10/08/2023   RBC 4.33 10/08/2023   RBC 4.33 10/08/2023   Lab Results  Component Value Date   KPAFRELGTCHN 12.5 07/01/2023   LAMBDASER 10.2 07/01/2023   KAPLAMBRATIO 1.23 07/01/2023   Lab Results  Component Value Date   IGGSERUM 878 07/01/2023   IGA 251 07/01/2023   IGMSERUM 108 07/01/2023   Lab Results  Component Value Date   TOTALPROTELP 6.6 07/01/2023     Chemistry      Component Value Date/Time   NA 142 10/08/2023 0949   K 4.1 10/08/2023 0949   CL 105 10/08/2023 0949   CO2 30 10/08/2023 0949   BUN 15 10/08/2023 0949   CREATININE 0.85 10/08/2023 0949  Component Value Date/Time   CALCIUM 9.1 10/08/2023 0949   ALKPHOS 47 10/08/2023 0949   AST 13 (L) 10/08/2023 0949   ALT 7 10/08/2023 0949   BILITOT 1.5 (H) 10/08/2023 0949     Encounter Diagnoses  Name Primary?   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   Platelets are stable at 127 We discussed s/s of low platelets such as heavy blood loss, excessive bruising and petechiae. She will contact our office with any questions or concerns. We can see Nancy Wallace sooner if needed.    B12 today RTC monthly for B12 injection RTC 1 year APP, labs (CBC w/, CMP, B12)  Rushie Chestnut, PA-C 3/25/202510:33 AM

## 2023-10-08 NOTE — Patient Instructions (Signed)
 Vitamin B12 Deficiency Vitamin B12 deficiency means that your body does not have enough vitamin B12. The body needs this important vitamin: To make red blood cells. To make genes (DNA). To help the nerves work. If you do not have enough vitamin B12 in your body, you can have health problems, such as not having enough red blood cells in the blood (anemia). What are the causes? Not eating enough foods that contain vitamin B12. Not being able to take in (absorb) vitamin B12 from the food that you eat. Certain diseases. A condition in which the body does not make enough of a certain protein. This results in your body not taking in enough vitamin B12. Having a surgery in which part of the stomach or small intestine is taken out. Taking medicines that make it hard for the body to take in vitamin B12. These include: Heartburn medicines. Some medicines that are used to treat diabetes. What increases the risk? Being an older adult. Eating a vegetarian or vegan diet that does not include any foods that come from animals. Not eating enough foods that contain vitamin B12 while you are pregnant. Taking certain medicines. Having alcoholism. What are the signs or symptoms? In some cases, there are no symptoms. If the condition leads to too few blood cells or nerve damage, symptoms can occur, such as: Feeling weak or tired. Not being hungry. Losing feeling (numbness) or tingling in your hands and feet. Redness and burning of the tongue. Feeling sad (depressed). Confusion or memory problems. Trouble walking. If anemia is very bad, symptoms can include: Being short of breath. Being dizzy. Having a very fast heartbeat. How is this treated? Changing the way you eat and drink, such as: Eating more foods that contain vitamin B12. Drinking little or no alcohol. Getting vitamin B12 shots. Taking vitamin B12 supplements by mouth (orally). Your doctor will tell you the dose that is best for you. Follow  these instructions at home: Eating and drinking  Eat foods that come from animals and have a lot of vitamin B12 in them. These include: Meats and poultry. This includes beef, pork, chicken, [place], and organ meats, such as liver. Seafood, such as clams, rainbow trout, salmon, tuna, and haddock. Eggs. Dairy foods such as milk, yogurt, and cheese. Eat breakfast cereals that have vitamin B12 added to them (are fortified). Check the label. The items listed above may not be a complete list of foods and beverages you can eat and drink. Contact a dietitian for more information. Alcohol use Do not drink alcohol if: Your doctor tells you not to drink. You are pregnant, may be pregnant, or are planning to become pregnant. If you drink alcohol: Limit how much you have to: 0-1 drink a day for women. 0-2 drinks a day for men. Know how much alcohol is in your drink. In the [place] one drink equals one 12 oz bottle of beer (355 mL), one 5 oz glass of wine (148 mL), or one 1 oz glass of hard liquor (44 mL). General instructions Get any vitamin B12 shots if told by your doctor. Take supplements only as told by your doctor. Follow the directions. Keep all follow-up visits. Contact a doctor if: Your symptoms come back. Your symptoms get worse or do not get better with treatment. Get help right away if: You have trouble breathing. You have a very fast heartbeat. You have chest pain. You get dizzy. You faint. These symptoms may be an emergency. Get help right away. Call 911.  Do not wait to see if the symptoms will go away. Do not drive yourself to the hospital. Summary Vitamin B12 deficiency means that your body is not getting enough of the vitamin. In some cases, there are no symptoms of this condition. Treatment may include making a change in the way you eat and drink, getting shots, or taking supplements. Eat foods that have vitamin B12 in them. This information is not intended to replace advice  given to you by your health care provider. Make sure you discuss any questions you have with your health care provider. Document Revised: 02/24/2021 Document Reviewed: 02/24/2021 Elsevier Patient Education  2024 [employer].

## 2023-11-05 ENCOUNTER — Inpatient Hospital Stay: Payer: BC Managed Care – PPO | Attending: Hematology & Oncology

## 2023-11-05 VITALS — BP 109/74 | HR 75 | Temp 97.8°F | Resp 18

## 2023-11-05 DIAGNOSIS — E538 Deficiency of other specified B group vitamins: Secondary | ICD-10-CM | POA: Diagnosis not present

## 2023-11-05 MED ORDER — CYANOCOBALAMIN 1000 MCG/ML IJ SOLN
1000.0000 ug | Freq: Once | INTRAMUSCULAR | Status: AC
  Administered 2023-11-05: 1000 ug via INTRAMUSCULAR
  Filled 2023-11-05: qty 1

## 2023-11-05 NOTE — Patient Instructions (Signed)
 Vitamin B12 Deficiency Vitamin B12 deficiency occurs when the body does not have enough of this important vitamin. The body needs this vitamin: To make red blood cells. To make DNA. This is the genetic material inside cells. To help the nerves work properly so they can carry messages from the brain to the body. Vitamin B12 deficiency can cause health problems, such as not having enough red blood cells in the blood (anemia). This can lead to nerve damage if untreated. What are the causes? This condition may be caused by: Not eating enough foods that contain vitamin B12. Not having enough stomach acid and digestive fluids to properly absorb vitamin B12 from the food that you eat. Having certain diseases that make it hard to absorb vitamin B12. These diseases include Crohn's disease, chronic pancreatitis, and cystic fibrosis. An autoimmune disorder in which the body does not make enough of a protein (intrinsic factor) within the stomach, resulting in not enough absorption of vitamin B12. Having a surgery in which part of the stomach or small intestine is removed. Taking certain medicines that make it hard for the body to absorb vitamin B12. These include: Heartburn medicines, such as antacids and proton pump inhibitors. Some medicines that are used to treat diabetes. What increases the risk? The following factors may make you more likely to develop a vitamin B12 deficiency: Being an older adult. Eating a vegetarian or vegan diet that does not include any foods that come from animals. Eating a poor diet while you are pregnant. Taking certain medicines. Having alcoholism. What are the signs or symptoms? In some cases, there are no symptoms of this condition. If the condition leads to anemia or nerve damage, various symptoms may occur, such as: Weakness. Tiredness (fatigue). Loss of appetite. Numbness or tingling in your hands and feet. Redness and burning of the tongue. Depression,  confusion, or memory problems. Trouble walking. If anemia is severe, symptoms can include: Shortness of breath. Dizziness. Rapid heart rate. How is this diagnosed? This condition may be diagnosed with a blood test to measure the level of vitamin B12 in your blood. You may also have other tests, including: A group of tests that measure certain characteristics of blood cells (complete blood count, CBC). A blood test to measure intrinsic factor. A procedure where a thin tube with a camera on the end is used to look into your stomach or intestines (endoscopy). Other tests may be needed to discover the cause of the deficiency. How is this treated? Treatment for this condition depends on the cause. This condition may be treated by: Changing your eating and drinking habits, such as: Eating more foods that contain vitamin B12. Drinking less alcohol or no alcohol. Getting vitamin B12 injections. Taking vitamin B12 supplements by mouth (orally). Your health care provider will tell you which dose is best for you. Follow these instructions at home: Eating and drinking  Include foods in your diet that come from animals and contain a lot of vitamin B12. These include: Meats and poultry. This includes beef, pork, chicken, [place], and organ meats, such as liver. Seafood. This includes clams, rainbow trout, salmon, tuna, and haddock. Eggs. Dairy foods such as milk, yogurt, and cheese. Eat foods that have vitamin B12 added to them (are fortified), such as ready-to-eat breakfast cereals. Check the label on the package to see if a food is fortified. The items listed above may not be a complete list of foods and beverages you can eat and drink. Contact a dietitian for  more information. Alcohol use Do not drink alcohol if: Your health care provider tells you not to drink. You are pregnant, may be pregnant, or are planning to become pregnant. If you drink alcohol: Limit how much you have to: 0-1 drink a  day for women. 0-2 drinks a day for men. Know how much alcohol is in your drink. In the [place] one drink equals one 12 oz bottle of beer (355 mL), one 5 oz glass of wine (148 mL), or one 1 oz glass of hard liquor (44 mL). General instructions Get vitamin B12 injections if told to by your health care provider. Take supplements only as told by your health care provider. Follow the directions carefully. Keep all follow-up visits. This is important. Contact a health care provider if: Your symptoms come back. Your symptoms get worse or do not improve with treatment. Get help right away: You develop shortness of breath. You have a rapid heart rate. You have chest pain. You become dizzy or you faint. These symptoms may be an emergency. Get help right away. Call 911. Do not wait to see if the symptoms will go away. Do not drive yourself to the hospital. Summary Vitamin B12 deficiency occurs when the body does not have enough of this important vitamin. Common causes include not eating enough foods that contain vitamin B12, not being able to absorb vitamin B12 from the food that you eat, having a surgery in which part of the stomach or small intestine is removed, or taking certain medicines. Eat foods that have vitamin B12 in them. Treatment may include making a change in the way you eat and drink, getting vitamin B12 injections, or taking vitamin B12 supplements. This information is not intended to replace advice given to you by your health care provider. Make sure you discuss any questions you have with your health care provider. Document Revised: 02/24/2021 Document Reviewed: 02/24/2021 Elsevier Patient Education  2024 [employer].

## 2023-12-03 ENCOUNTER — Inpatient Hospital Stay: Payer: BC Managed Care – PPO | Attending: Hematology & Oncology

## 2023-12-11 ENCOUNTER — Ambulatory Visit: Payer: BC Managed Care – PPO | Admitting: Neurology

## 2023-12-11 ENCOUNTER — Encounter: Payer: Self-pay | Admitting: Neurology

## 2024-01-07 ENCOUNTER — Inpatient Hospital Stay: Payer: BC Managed Care – PPO | Attending: Hematology & Oncology

## 2024-02-04 ENCOUNTER — Inpatient Hospital Stay: Payer: BC Managed Care – PPO | Attending: Hematology & Oncology

## 2024-03-10 ENCOUNTER — Inpatient Hospital Stay: Payer: BC Managed Care – PPO | Attending: Hematology & Oncology

## 2024-03-23 ENCOUNTER — Encounter: Payer: Self-pay | Admitting: Internal Medicine

## 2024-03-23 DIAGNOSIS — R002 Palpitations: Secondary | ICD-10-CM

## 2024-03-24 ENCOUNTER — Ambulatory Visit: Attending: Internal Medicine

## 2024-03-24 DIAGNOSIS — R002 Palpitations: Secondary | ICD-10-CM

## 2024-03-24 NOTE — Progress Notes (Unsigned)
 Enrolled for [employer] to mail a ZIO XT long term holter monitor to the patients address on file.

## 2024-04-07 ENCOUNTER — Inpatient Hospital Stay: Payer: BC Managed Care – PPO | Attending: Hematology & Oncology

## 2024-05-12 ENCOUNTER — Inpatient Hospital Stay: Payer: BC Managed Care – PPO | Attending: Hematology & Oncology

## 2024-05-12 VITALS — BP 132/82 | HR 91 | Temp 97.8°F | Resp 20

## 2024-05-12 DIAGNOSIS — E538 Deficiency of other specified B group vitamins: Secondary | ICD-10-CM | POA: Diagnosis not present

## 2024-05-12 DIAGNOSIS — Z79899 Other long term (current) drug therapy: Secondary | ICD-10-CM | POA: Diagnosis not present

## 2024-05-12 MED ORDER — CYANOCOBALAMIN 1000 MCG/ML IJ SOLN
1000.0000 ug | Freq: Once | INTRAMUSCULAR | Status: AC
  Administered 2024-05-12: 1000 ug via INTRAMUSCULAR
  Filled 2024-05-12: qty 1

## 2024-05-12 NOTE — Patient Instructions (Signed)
 Vitamin B12 Deficiency Vitamin B12 deficiency occurs when the body does not have enough of this important vitamin. The body needs this vitamin: To make red blood cells. To make DNA. This is the genetic material inside cells. To help the nerves work properly so they can carry messages from the brain to the body. Vitamin B12 deficiency can cause health problems, such as not having enough red blood cells in the blood (anemia). This can lead to nerve damage if untreated. What are the causes? This condition may be caused by: Not eating enough foods that contain vitamin B12. Not having enough stomach acid and digestive fluids to properly absorb vitamin B12 from the food that you eat. Having certain diseases that make it hard to absorb vitamin B12. These diseases include Crohn's disease, chronic pancreatitis, and cystic fibrosis. An autoimmune disorder in which the body does not make enough of a protein (intrinsic factor) within the stomach, resulting in not enough absorption of vitamin B12. Having a surgery in which part of the stomach or small intestine is removed. Taking certain medicines that make it hard for the body to absorb vitamin B12. These include: Heartburn medicines, such as antacids and proton pump inhibitors. Some medicines that are used to treat diabetes. What increases the risk? The following factors may make you more likely to develop a vitamin B12 deficiency: Being an older adult. Eating a vegetarian or vegan diet that does not include any foods that come from animals. Eating a poor diet while you are pregnant. Taking certain medicines. Having alcoholism. What are the signs or symptoms? In some cases, there are no symptoms of this condition. If the condition leads to anemia or nerve damage, various symptoms may occur, such as: Weakness. Tiredness (fatigue). Loss of appetite. Numbness or tingling in your hands and feet. Redness and burning of the tongue. Depression,  confusion, or memory problems. Trouble walking. If anemia is severe, symptoms can include: Shortness of breath. Dizziness. Rapid heart rate. How is this diagnosed? This condition may be diagnosed with a blood test to measure the level of vitamin B12 in your blood. You may also have other tests, including: A group of tests that measure certain characteristics of blood cells (complete blood count, CBC). A blood test to measure intrinsic factor. A procedure where a thin tube with a camera on the end is used to look into your stomach or intestines (endoscopy). Other tests may be needed to discover the cause of the deficiency. How is this treated? Treatment for this condition depends on the cause. This condition may be treated by: Changing your eating and drinking habits, such as: Eating more foods that contain vitamin B12. Drinking less alcohol or no alcohol. Getting vitamin B12 injections. Taking vitamin B12 supplements by mouth (orally). Your health care provider will tell you which dose is best for you. Follow these instructions at home: Eating and drinking  Include foods in your diet that come from animals and contain a lot of vitamin B12. These include: Meats and poultry. This includes beef, pork, chicken, [place], and organ meats, such as liver. Seafood. This includes clams, rainbow trout, salmon, tuna, and haddock. Eggs. Dairy foods such as milk, yogurt, and cheese. Eat foods that have vitamin B12 added to them (are fortified), such as ready-to-eat breakfast cereals. Check the label on the package to see if a food is fortified. The items listed above may not be a complete list of foods and beverages you can eat and drink. Contact a dietitian for  more information. Alcohol use Do not drink alcohol if: Your health care provider tells you not to drink. You are pregnant, may be pregnant, or are planning to become pregnant. If you drink alcohol: Limit how much you have to: 0-1 drink a  day for women. 0-2 drinks a day for men. Know how much alcohol is in your drink. In the [place] one drink equals one 12 oz bottle of beer (355 mL), one 5 oz glass of wine (148 mL), or one 1 oz glass of hard liquor (44 mL). General instructions Get vitamin B12 injections if told to by your health care provider. Take supplements only as told by your health care provider. Follow the directions carefully. Keep all follow-up visits. This is important. Contact a health care provider if: Your symptoms come back. Your symptoms get worse or do not improve with treatment. Get help right away: You develop shortness of breath. You have a rapid heart rate. You have chest pain. You become dizzy or you faint. These symptoms may be an emergency. Get help right away. Call 911. Do not wait to see if the symptoms will go away. Do not drive yourself to the hospital. Summary Vitamin B12 deficiency occurs when the body does not have enough of this important vitamin. Common causes include not eating enough foods that contain vitamin B12, not being able to absorb vitamin B12 from the food that you eat, having a surgery in which part of the stomach or small intestine is removed, or taking certain medicines. Eat foods that have vitamin B12 in them. Treatment may include making a change in the way you eat and drink, getting vitamin B12 injections, or taking vitamin B12 supplements. This information is not intended to replace advice given to you by your health care provider. Make sure you discuss any questions you have with your health care provider. Document Revised: 02/24/2021 Document Reviewed: 02/24/2021 Elsevier Patient Education  2024 [employer].

## 2024-06-04 ENCOUNTER — Encounter: Payer: Self-pay | Admitting: Family Medicine

## 2024-06-05 ENCOUNTER — Encounter: Payer: Self-pay | Admitting: Hematology & Oncology

## 2024-06-09 ENCOUNTER — Inpatient Hospital Stay: Payer: BC Managed Care – PPO | Attending: Hematology & Oncology

## 2024-06-09 VITALS — BP 122/85 | HR 81 | Temp 98.2°F | Resp 18

## 2024-06-09 DIAGNOSIS — Z79899 Other long term (current) drug therapy: Secondary | ICD-10-CM | POA: Insufficient documentation

## 2024-06-09 DIAGNOSIS — E538 Deficiency of other specified B group vitamins: Secondary | ICD-10-CM | POA: Insufficient documentation

## 2024-06-09 MED ORDER — CYANOCOBALAMIN 1000 MCG/ML IJ SOLN
1000.0000 ug | Freq: Once | INTRAMUSCULAR | Status: AC
  Administered 2024-06-09: 1000 ug via INTRAMUSCULAR
  Filled 2024-06-09: qty 1

## 2024-06-09 NOTE — Patient Instructions (Signed)
 Vitamin B12 Deficiency Vitamin B12 deficiency occurs when the body does not have enough of this important vitamin. The body needs this vitamin: To make red blood cells. To make DNA. This is the genetic material inside cells. To help the nerves work properly so they can carry messages from the brain to the body. Vitamin B12 deficiency can cause health problems, such as not having enough red blood cells in the blood (anemia). This can lead to nerve damage if untreated. What are the causes? This condition may be caused by: Not eating enough foods that contain vitamin B12. Not having enough stomach acid and digestive fluids to properly absorb vitamin B12 from the food that you eat. Having certain diseases that make it hard to absorb vitamin B12. These diseases include Crohn's disease, chronic pancreatitis, and cystic fibrosis. An autoimmune disorder in which the body does not make enough of a protein (intrinsic factor) within the stomach, resulting in not enough absorption of vitamin B12. Having a surgery in which part of the stomach or small intestine is removed. Taking certain medicines that make it hard for the body to absorb vitamin B12. These include: Heartburn medicines, such as antacids and proton pump inhibitors. Some medicines that are used to treat diabetes. What increases the risk? The following factors may make you more likely to develop a vitamin B12 deficiency: Being an older adult. Eating a vegetarian or vegan diet that does not include any foods that come from animals. Eating a poor diet while you are pregnant. Taking certain medicines. Having alcoholism. What are the signs or symptoms? In some cases, there are no symptoms of this condition. If the condition leads to anemia or nerve damage, various symptoms may occur, such as: Weakness. Tiredness (fatigue). Loss of appetite. Numbness or tingling in your hands and feet. Redness and burning of the tongue. Depression,  confusion, or memory problems. Trouble walking. If anemia is severe, symptoms can include: Shortness of breath. Dizziness. Rapid heart rate. How is this diagnosed? This condition may be diagnosed with a blood test to measure the level of vitamin B12 in your blood. You may also have other tests, including: A group of tests that measure certain characteristics of blood cells (complete blood count, CBC). A blood test to measure intrinsic factor. A procedure where a thin tube with a camera on the end is used to look into your stomach or intestines (endoscopy). Other tests may be needed to discover the cause of the deficiency. How is this treated? Treatment for this condition depends on the cause. This condition may be treated by: Changing your eating and drinking habits, such as: Eating more foods that contain vitamin B12. Drinking less alcohol or no alcohol. Getting vitamin B12 injections. Taking vitamin B12 supplements by mouth (orally). Your health care provider will tell you which dose is best for you. Follow these instructions at home: Eating and drinking  Include foods in your diet that come from animals and contain a lot of vitamin B12. These include: Meats and poultry. This includes beef, pork, chicken, [place], and organ meats, such as liver. Seafood. This includes clams, rainbow trout, salmon, tuna, and haddock. Eggs. Dairy foods such as milk, yogurt, and cheese. Eat foods that have vitamin B12 added to them (are fortified), such as ready-to-eat breakfast cereals. Check the label on the package to see if a food is fortified. The items listed above may not be a complete list of foods and beverages you can eat and drink. Contact a dietitian for  more information. Alcohol use Do not drink alcohol if: Your health care provider tells you not to drink. You are pregnant, may be pregnant, or are planning to become pregnant. If you drink alcohol: Limit how much you have to: 0-1 drink a  day for women. 0-2 drinks a day for men. Know how much alcohol is in your drink. In the [place] one drink equals one 12 oz bottle of beer (355 mL), one 5 oz glass of wine (148 mL), or one 1 oz glass of hard liquor (44 mL). General instructions Get vitamin B12 injections if told to by your health care provider. Take supplements only as told by your health care provider. Follow the directions carefully. Keep all follow-up visits. This is important. Contact a health care provider if: Your symptoms come back. Your symptoms get worse or do not improve with treatment. Get help right away: You develop shortness of breath. You have a rapid heart rate. You have chest pain. You become dizzy or you faint. These symptoms may be an emergency. Get help right away. Call 911. Do not wait to see if the symptoms will go away. Do not drive yourself to the hospital. Summary Vitamin B12 deficiency occurs when the body does not have enough of this important vitamin. Common causes include not eating enough foods that contain vitamin B12, not being able to absorb vitamin B12 from the food that you eat, having a surgery in which part of the stomach or small intestine is removed, or taking certain medicines. Eat foods that have vitamin B12 in them. Treatment may include making a change in the way you eat and drink, getting vitamin B12 injections, or taking vitamin B12 supplements. This information is not intended to replace advice given to you by your health care provider. Make sure you discuss any questions you have with your health care provider. Document Revised: 02/24/2021 Document Reviewed: 02/24/2021 Elsevier Patient Education  2024 [employer].

## 2024-06-10 ENCOUNTER — Encounter: Payer: Self-pay | Admitting: Hematology & Oncology

## 2024-06-29 ENCOUNTER — Inpatient Hospital Stay

## 2024-06-29 ENCOUNTER — Ambulatory Visit: Payer: BC Managed Care – PPO | Admitting: Medical Oncology

## 2024-06-29 ENCOUNTER — Other Ambulatory Visit: Payer: Self-pay | Admitting: *Deleted

## 2024-06-29 ENCOUNTER — Encounter: Payer: Self-pay | Admitting: Medical Oncology

## 2024-06-29 ENCOUNTER — Inpatient Hospital Stay: Payer: BC Managed Care – PPO | Attending: Hematology & Oncology

## 2024-06-29 VITALS — BP 108/79 | HR 82 | Temp 98.0°F | Resp 18 | Ht 66.0 in | Wt 123.4 lb

## 2024-06-29 DIAGNOSIS — E611 Iron deficiency: Secondary | ICD-10-CM | POA: Diagnosis not present

## 2024-06-29 DIAGNOSIS — E538 Deficiency of other specified B group vitamins: Secondary | ICD-10-CM

## 2024-06-29 DIAGNOSIS — D693 Immune thrombocytopenic purpura: Secondary | ICD-10-CM | POA: Diagnosis not present

## 2024-06-29 DIAGNOSIS — R200 Anesthesia of skin: Secondary | ICD-10-CM

## 2024-06-29 DIAGNOSIS — K068 Other specified disorders of gingiva and edentulous alveolar ridge: Secondary | ICD-10-CM | POA: Insufficient documentation

## 2024-06-29 DIAGNOSIS — D696 Thrombocytopenia, unspecified: Secondary | ICD-10-CM | POA: Insufficient documentation

## 2024-06-29 DIAGNOSIS — Z79899 Other long term (current) drug therapy: Secondary | ICD-10-CM | POA: Insufficient documentation

## 2024-06-29 LAB — CBC WITH DIFFERENTIAL (CANCER CENTER ONLY)
Abs Immature Granulocytes: 0 K/uL (ref 0.00–0.07)
Basophils Absolute: 0 K/uL (ref 0.0–0.1)
Basophils Relative: 1 %
Eosinophils Absolute: 0.1 K/uL (ref 0.0–0.5)
Eosinophils Relative: 1 %
HCT: 41.3 % (ref 36.0–46.0)
Hemoglobin: 13.7 g/dL (ref 12.0–15.0)
Immature Granulocytes: 0 %
Lymphocytes Relative: 24 %
Lymphs Abs: 1 K/uL (ref 0.7–4.0)
MCH: 31.9 pg (ref 26.0–34.0)
MCHC: 33.2 g/dL (ref 30.0–36.0)
MCV: 96 fL (ref 80.0–100.0)
Monocytes Absolute: 0.4 K/uL (ref 0.1–1.0)
Monocytes Relative: 9 %
Neutro Abs: 2.8 K/uL (ref 1.7–7.7)
Neutrophils Relative %: 65 %
Platelet Count: 133 K/uL — ABNORMAL LOW (ref 150–400)
RBC: 4.3 MIL/uL (ref 3.87–5.11)
RDW: 11.9 % (ref 11.5–15.5)
WBC Count: 4.3 K/uL (ref 4.0–10.5)
nRBC: 0 % (ref 0.0–0.2)

## 2024-06-29 LAB — CMP (CANCER CENTER ONLY)
ALT: 10 U/L (ref 0–44)
AST: 16 U/L (ref 15–41)
Albumin: 4.5 g/dL (ref 3.5–5.0)
Alkaline Phosphatase: 48 U/L (ref 38–126)
Anion gap: 10 (ref 5–15)
BUN: 12 mg/dL (ref 6–20)
CO2: 27 mmol/L (ref 22–32)
Calcium: 9.4 mg/dL (ref 8.9–10.3)
Chloride: 106 mmol/L (ref 98–111)
Creatinine: 0.66 mg/dL (ref 0.44–1.00)
GFR, Estimated: >60 mL/min (ref >60)
Glucose, Bld: 95 mg/dL (ref 70–99)
Potassium: 4.1 mmol/L (ref 3.5–5.1)
Sodium: 142 mmol/L (ref 135–145)
Total Bilirubin: 1.1 mg/dL (ref 0.0–1.2)
Total Protein: 6.9 g/dL (ref 6.5–8.1)

## 2024-06-29 LAB — VITAMIN B12: Vitamin B-12: 428 pg/mL (ref 180–914)

## 2024-06-29 MED ORDER — CYANOCOBALAMIN 1000 MCG/ML IJ SOLN
1000.0000 ug | Freq: Once | INTRAMUSCULAR | Status: AC
  Administered 2024-06-29: 16:00:00 1000 ug via INTRAMUSCULAR
  Filled 2024-06-29: qty 1

## 2024-06-29 NOTE — Patient Instructions (Signed)
 2,000- 5,000 International Units of Vitamin D3 once daily (can get some with K2 to help it absorb) See if this helps with the joint pains- if not I would ask your PCP to draw inflammatory labs including a rheumatoid panel.

## 2024-06-29 NOTE — Patient Instructions (Signed)
 Vitamin B12 Injection What is this medication? Vitamin B12 (VAHY tuh min B12) prevents and treats low vitamin B12 levels in your body. It is used in people who do not get enough vitamin B12 from their diet or when their digestive tract does not absorb enough. Vitamin B12 plays an important role in maintaining the health of your nervous system and red blood cells. This medicine may be used for other purposes; ask your health care provider or pharmacist if you have questions. COMMON BRAND NAME(S): B-12 Compliance Kit, B-12 Injection Kit, Cyomin, Dodex , LA-12, Nutri-Twelve, Physicians EZ Use B-12, Primabalt, Vitamin Deficiency Injectable System - B12 What should I tell my care team before I take this medication? They need to know if you have any of these conditions: Kidney disease Leber's disease Megaloblastic anemia An unusual or allergic reaction to cyanocobalamin , cobalt, other medications, foods, dyes, or preservatives Pregnant or trying to get pregnant Breast-feeding How should I use this medication? This medication is injected into a muscle or deeply under the skin. It is usually given in a clinic or care team's office. However, your care team may teach you how to inject yourself. Follow all instructions. Talk to your care team about the use of this medication in children. Special care may be needed. Overdosage: If you think you have taken too much of this medicine contact a poison control center or emergency room at once. NOTE: This medicine is only for you. Do not share this medicine with others. What if I miss a dose? If you are given your dose at a clinic or care team's office, call to reschedule your appointment. If you give your own injections, and you miss a dose, take it as soon as you can. If it is almost time for your next dose, take only that dose. Do not take double or extra doses. What may interact with this medication? Alcohol Colchicine This list may not describe all possible  interactions. Give your health care provider a list of all the medicines, herbs, non-prescription drugs, or dietary supplements you use. Also tell them if you smoke, drink alcohol, or use illegal drugs. Some items may interact with your medicine. What should I watch for while using this medication? Visit your care team regularly. You may need blood work done while you are taking this medication. You may need to follow a special diet. Talk to your care team. Limit your alcohol intake and avoid smoking to get the best benefit. What side effects may I notice from receiving this medication? Side effects that you should report to your care team as soon as possible: Allergic reactions--skin rash, itching, hives, swelling of the face, lips, tongue, or throat Swelling of the ankles, hands, or feet Trouble breathing Side effects that usually do not require medical attention (report to your care team if they continue or are bothersome): Diarrhea This list may not describe all possible side effects. Call your doctor for medical advice about side effects. You may report side effects to FDA at 1-800-FDA-1088. Where should I keep my medication? Keep out of the reach of children. Store at room temperature between 15 and 30 degrees C (59 and 85 degrees F). Protect from light. Throw away any unused medication after the expiration date. NOTE: This sheet is a summary. It may not cover all possible information. If you have questions about this medicine, talk to your doctor, pharmacist, or health care provider.  2024 Elsevier/Gold Standard (2021-03-14 00:00:00)

## 2024-06-29 NOTE — Progress Notes (Signed)
 Hematology and Oncology Follow Up Visit  Nancy Wallace [id number] 05/17/1996 28 y.o. 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 We see her about once every year.   We started her on B12 injections for low B12 levels with concurrent numbness and tingling- fortunately symptoms improved with this regimen.   Menstrual cycles tend to be heavy in nature. She does get bleeding from the gums which is chronic. No other blood loss noted.  No fever, chills, n/v, cough, rash, dizziness, chest pain, abdominal pain or changes in bowel or bladder habits.  No swelling, tenderness, numbness or tingling in her extremities.  No falls or syncope.  Appetite and hydration are good.   Wt Readings from Last 3 Encounters:  06/29/24 123 lb 6.4 oz (56 kg)  10/08/23 121 lb (54.9 kg)  07/01/23 120 lb (54.4 kg)   ECOG Performance Status: 0 - Asymptomatic  Medications:  Allergies as of 06/29/2024   No Known Allergies      Medication List        Accurate as of June 29, 2024  3:24 PM. If you have any questions, ask your nurse or doctor.          STOP taking these medications    gabapentin  100 MG capsule Commonly known as: Neurontin  Stopped by: Nancy Wallace       TAKE these medications    ferrous sulfate 325 (65 FE) MG EC tablet Take 325 mg by mouth daily with breakfast.   VITAMIN D  (ERGOCALCIFEROL ) PO Take 1 tablet by mouth daily.        Allergies: No Known Allergies  Past Medical History, Surgical history, Social history, and Family History were reviewed and updated.  Review of Systems: All other 10 point review of systems is negative.   Physical Exam:  height is 5' 6 (1.676 m) and weight is 123 lb 6.4 oz (56 kg). Her oral temperature is 98 F (36.7 C). Her blood pressure is  108/79 and her pulse is 82. Her respiration is 18 and oxygen saturation is 100%.   Wt Readings from Last 3 Encounters:  06/29/24 123 lb 6.4 oz (56 kg)  10/08/23 121 lb (54.9 kg)  07/01/23 120 lb (54.4 kg)    Ocular: Sclerae unicteric, pupils equal, round and reactive to light Ear-nose-throat: Oropharynx clear, dentition fair Lymphatic: No cervical or supraclavicular adenopathy Lungs no rales or rhonchi, good excursion bilaterally Heart regular rate and rhythm, no murmur appreciated MSK no focal spinal tenderness, no joint edema Neuro: non-focal, well-oriented, appropriate affect  Lab Results  Component Value Date   WBC 4.3 06/29/2024   HGB 13.7 06/29/2024   HCT 41.3 06/29/2024   MCV 96.0 06/29/2024   PLT 133 (L) 06/29/2024   Lab Results  Component Value Date   FERRITIN 8 (L) 10/08/2023   IRON 99 10/08/2023   TIBC 416 10/08/2023   UIBC 317 10/08/2023   IRONPCTSAT 24 10/08/2023   Lab Results  Component Value Date   RETICCTPCT 1.6 10/08/2023   RBC 4.30 06/29/2024   Lab Results  Component Value Date   KPAFRELGTCHN 12.5 07/01/2023   LAMBDASER 10.2 07/01/2023   KAPLAMBRATIO 1.23 07/01/2023   Lab Results  Component Value Date   IGGSERUM 878 07/01/2023   IGA 251 07/01/2023   IGMSERUM 108 07/01/2023  Lab Results  Component Value Date   TOTALPROTELP 6.6 07/01/2023     Chemistry      Component Value Date/Time   NA 142 10/08/2023 0949   K 4.1 10/08/2023 0949   CL 105 10/08/2023 0949   CO2 30 10/08/2023 0949   BUN 15 10/08/2023 0949   CREATININE 0.85 10/08/2023 0949      Component Value Date/Time   CALCIUM 9.1 10/08/2023 0949   ALKPHOS 47 10/08/2023 0949   AST 13 (L) 10/08/2023 0949   ALT 7 10/08/2023 0949   BILITOT 1.5 (H) 10/08/2023 0949     Encounter Diagnoses  Name Primary?   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   Platelets are stable at 133 If her iron is low again we will go with IV iron. We discussed how this is administered including common side effects and the low risk of allergic reaction.  She is not currently pregnant. She did ask about egg retrieval as she is considering this. With her current platelet levels I would have no concerns with her having this procedure.  We discussed s/s of low platelets such as heavy blood loss, excessive bruising and petechiae. She will contact our office with any questions or concerns. We can see her sooner if needed.   B12 today RTC monthly for B12 injection RTC 1 year APP, labs (CBC w/, CMP, B12)  Nancy CHRISTELLA Dais, PA-C 12/15/20253:24 PM

## 2024-06-30 LAB — FERRITIN: Ferritin: 33 ng/mL (ref 11–307)

## 2024-07-01 ENCOUNTER — Ambulatory Visit: Payer: Self-pay | Admitting: Medical Oncology

## 2024-07-27 ENCOUNTER — Inpatient Hospital Stay

## 2024-07-31 ENCOUNTER — Inpatient Hospital Stay: Attending: Hematology & Oncology

## 2024-08-24 ENCOUNTER — Inpatient Hospital Stay

## 2024-09-21 ENCOUNTER — Inpatient Hospital Stay

## 2024-10-19 ENCOUNTER — Inpatient Hospital Stay

## 2024-11-16 ENCOUNTER — Inpatient Hospital Stay

## 2024-12-14 ENCOUNTER — Inpatient Hospital Stay

## 2025-01-11 ENCOUNTER — Inpatient Hospital Stay

## 2025-02-08 ENCOUNTER — Inpatient Hospital Stay

## 2025-03-08 ENCOUNTER — Inpatient Hospital Stay

## 2025-04-05 ENCOUNTER — Inpatient Hospital Stay

## 2025-05-03 ENCOUNTER — Inpatient Hospital Stay

## 2025-05-31 ENCOUNTER — Inpatient Hospital Stay

## 2025-06-28 ENCOUNTER — Inpatient Hospital Stay

## 2025-06-28 ENCOUNTER — Inpatient Hospital Stay: Admitting: Medical Oncology
